# Patient Record
Sex: Male | Born: 1945
Health system: Southern US, Community
[De-identification: ages and names within clinical notes are randomized; demographics above are authoritative.]

## PROBLEM LIST (undated history)

## (undated) DIAGNOSIS — E785 Hyperlipidemia, unspecified: Secondary | ICD-10-CM

## (undated) DIAGNOSIS — M1990 Unspecified osteoarthritis, unspecified site: Secondary | ICD-10-CM

## (undated) DIAGNOSIS — H9190 Unspecified hearing loss, unspecified ear: Secondary | ICD-10-CM

## (undated) DIAGNOSIS — I10 Essential (primary) hypertension: Secondary | ICD-10-CM

## (undated) DIAGNOSIS — M545 Low back pain, unspecified: Secondary | ICD-10-CM

## (undated) DIAGNOSIS — I25810 Atherosclerosis of coronary artery bypass graft(s) without angina pectoris: Secondary | ICD-10-CM

## (undated) DIAGNOSIS — I714 Abdominal aortic aneurysm, without rupture, unspecified: Secondary | ICD-10-CM

## (undated) DIAGNOSIS — R197 Diarrhea, unspecified: Secondary | ICD-10-CM

## (undated) DIAGNOSIS — K5790 Diverticulosis of intestine, part unspecified, without perforation or abscess without bleeding: Secondary | ICD-10-CM

## (undated) DIAGNOSIS — I4892 Unspecified atrial flutter: Secondary | ICD-10-CM

## (undated) DIAGNOSIS — I491 Atrial premature depolarization: Secondary | ICD-10-CM

## (undated) DIAGNOSIS — K635 Polyp of colon: Secondary | ICD-10-CM

## (undated) DIAGNOSIS — K649 Unspecified hemorrhoids: Secondary | ICD-10-CM

## (undated) DIAGNOSIS — I2510 Atherosclerotic heart disease of native coronary artery without angina pectoris: Secondary | ICD-10-CM

## (undated) HISTORY — DX: Unspecified atrial flutter: I48.92

## (undated) HISTORY — PX: TONSILLECTOMY: SUR1361

## (undated) HISTORY — DX: Low back pain, unspecified: M54.50

## (undated) HISTORY — DX: Essential (primary) hypertension: I10

## (undated) HISTORY — PX: CORONARY ARTERY BYPASS GRAFT: SHX141

## (undated) HISTORY — DX: Atrial premature depolarization: I49.1

## (undated) HISTORY — DX: Hyperlipidemia, unspecified: E78.5

## (undated) HISTORY — DX: Diarrhea, unspecified: R19.7

## (undated) HISTORY — DX: Unspecified hemorrhoids: K64.9

## (undated) HISTORY — DX: Abdominal aortic aneurysm, without rupture: I71.4

## (undated) HISTORY — DX: Diverticulosis of intestine, part unspecified, without perforation or abscess without bleeding: K57.90

## (undated) HISTORY — DX: Atherosclerosis of coronary artery bypass graft(s) without angina pectoris: I25.810

## (undated) HISTORY — DX: Abdominal aortic aneurysm, without rupture, unspecified: I71.40

## (undated) HISTORY — DX: Atherosclerotic heart disease of native coronary artery without angina pectoris: I25.10

## (undated) HISTORY — DX: Unspecified osteoarthritis, unspecified site: M19.90

## (undated) HISTORY — DX: Unspecified hearing loss, unspecified ear: H91.90

## (undated) HISTORY — DX: Low back pain: M54.5

## (undated) HISTORY — PX: LUMBAR DISC ARTHROPLASTY: SHX699

---
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
## 2005-09-11 ENCOUNTER — Emergency Department (HOSPITAL_COMMUNITY): Admission: EM | Admit: 2005-09-11 | Discharge: 2005-09-11 | Payer: Self-pay | Admitting: Emergency Medicine

## 2005-11-13 HISTORY — PX: MAZE: SHX5063

## 2005-11-13 HISTORY — PX: CORONARY ARTERY BYPASS GRAFT: SHX141

## 2006-05-08 ENCOUNTER — Encounter: Payer: Self-pay | Admitting: Cardiology

## 2006-05-08 ENCOUNTER — Inpatient Hospital Stay (HOSPITAL_COMMUNITY): Admission: EM | Admit: 2006-05-08 | Discharge: 2006-05-22 | Payer: Self-pay | Admitting: Cardiology

## 2006-05-09 ENCOUNTER — Encounter: Payer: Self-pay | Admitting: Vascular Surgery

## 2006-06-14 ENCOUNTER — Encounter (HOSPITAL_COMMUNITY): Admission: RE | Admit: 2006-06-14 | Discharge: 2006-09-12 | Payer: Self-pay | Admitting: Cardiology

## 2010-08-03 ENCOUNTER — Ambulatory Visit: Payer: Self-pay | Admitting: Cardiology

## 2011-03-01 ENCOUNTER — Ambulatory Visit: Payer: Self-pay | Admitting: Cardiology

## 2011-03-01 ENCOUNTER — Other Ambulatory Visit: Payer: Self-pay | Admitting: *Deleted

## 2011-03-06 ENCOUNTER — Other Ambulatory Visit: Payer: Self-pay | Admitting: *Deleted

## 2011-03-06 DIAGNOSIS — E7800 Pure hypercholesterolemia, unspecified: Secondary | ICD-10-CM

## 2011-03-09 ENCOUNTER — Encounter: Payer: Self-pay | Admitting: Cardiology

## 2011-03-09 DIAGNOSIS — I4892 Unspecified atrial flutter: Secondary | ICD-10-CM | POA: Insufficient documentation

## 2011-03-09 DIAGNOSIS — M545 Low back pain, unspecified: Secondary | ICD-10-CM | POA: Insufficient documentation

## 2011-03-09 DIAGNOSIS — E785 Hyperlipidemia, unspecified: Secondary | ICD-10-CM | POA: Insufficient documentation

## 2011-03-15 ENCOUNTER — Other Ambulatory Visit (INDEPENDENT_AMBULATORY_CARE_PROVIDER_SITE_OTHER): Payer: Medicare Other | Admitting: *Deleted

## 2011-03-15 ENCOUNTER — Ambulatory Visit (INDEPENDENT_AMBULATORY_CARE_PROVIDER_SITE_OTHER): Payer: Medicare Other | Admitting: Cardiology

## 2011-03-15 ENCOUNTER — Encounter: Payer: Self-pay | Admitting: Cardiology

## 2011-03-15 ENCOUNTER — Ambulatory Visit: Payer: Self-pay | Admitting: Cardiology

## 2011-03-15 ENCOUNTER — Telehealth: Payer: Self-pay | Admitting: *Deleted

## 2011-03-15 VITALS — BP 112/66 | HR 74 | Ht 68.0 in | Wt 197.4 lb

## 2011-03-15 DIAGNOSIS — I2510 Atherosclerotic heart disease of native coronary artery without angina pectoris: Secondary | ICD-10-CM | POA: Insufficient documentation

## 2011-03-15 DIAGNOSIS — I491 Atrial premature depolarization: Secondary | ICD-10-CM

## 2011-03-15 DIAGNOSIS — E7800 Pure hypercholesterolemia, unspecified: Secondary | ICD-10-CM

## 2011-03-15 DIAGNOSIS — E785 Hyperlipidemia, unspecified: Secondary | ICD-10-CM

## 2011-03-15 LAB — HEPATIC FUNCTION PANEL
AST: 26 U/L (ref 0–37)
Albumin: 3.8 g/dL (ref 3.5–5.2)
Bilirubin, Direct: 0.1 mg/dL (ref 0.0–0.3)
Total Bilirubin: 1 mg/dL (ref 0.3–1.2)
Total Protein: 6.7 g/dL (ref 6.0–8.3)

## 2011-03-15 LAB — LIPID PANEL
Cholesterol: 146 mg/dL (ref 0–200)
HDL: 39.3 mg/dL (ref >39.00)
LDL Cholesterol: 85 mg/dL (ref 0–99)
Triglycerides: 107 mg/dL (ref 0.0–149.0)

## 2011-03-15 LAB — BASIC METABOLIC PANEL
BUN: 18 mg/dL (ref 6–23)
CO2: 27 mEq/L (ref 19–32)
Calcium: 8.8 mg/dL (ref 8.4–10.5)
Creatinine, Ser: 0.9 mg/dL (ref 0.4–1.5)
Glucose, Bld: 101 mg/dL — ABNORMAL HIGH (ref 70–99)
Sodium: 139 mEq/L (ref 135–145)

## 2011-03-15 MED ORDER — CARVEDILOL 6.25 MG PO TABS: 6.2500 mg | ORAL_TABLET | Freq: Two times a day (BID) | ORAL | Status: DC

## 2011-03-15 NOTE — Assessment & Plan Note (Addendum)
He is having no significant anginal symptoms. He remains active. Continue with risk factor modification with blood pressure and lipid control. I have encouraged him to get regular aerobic exercise 5-7 days a week. He needs to lose weight. I will increase his carvedilol dose to twice daily. I would consider a stress nuclear study next year.

## 2011-03-15 NOTE — Assessment & Plan Note (Signed)
Pulses are regular consistent with PACs. He is asymptomatic. ECG in September showed PACs.

## 2011-03-15 NOTE — Telephone Encounter (Signed)
Notified of lab results. 

## 2011-03-15 NOTE — Telephone Encounter (Signed)
Message copied by Murrell Redden on Wed Mar 15, 2011  5:26 PM ------      Message from: [place], PETER      Created: Wed Mar 15, 2011  1:39 PM       Chemistries all ok. Lipids stable. Continue current meds but needs to lose weight.

## 2011-03-15 NOTE — Patient Instructions (Signed)
We will call with the results of your blood work today.  Increase your Coreg to twice a day.  We will see you for a follow up visit in 6 months.  Keep up with your regular exercise.

## 2011-03-15 NOTE — Assessment & Plan Note (Signed)
We will followup on his fasting lab work today and adjust as needed. I have encouraged him to lose weight.

## 2011-03-15 NOTE — Progress Notes (Signed)
   [name] Date of Birth: 04-01-1946   History of Present Illness: [name] is seen for 6 months followup today. He states he has been doing very well over the past 6 months. He remains unemployed. He is doing a lot of yard work which is his main activity. He denies any palpitations, chest pain, shortness of breath, or edema. He has been taking his carvedilol once a day.  Current Outpatient Prescriptions on File Prior to Visit  Medication Sig Dispense Refill  . aspirin 81 MG tablet Take 81 mg by mouth daily.        Marland Kitchen atorvastatin (LIPITOR) 40 MG tablet Take 40 mg by mouth daily.        . B Complex-Biotin-FA (B-COMPLEX PO) Take by mouth daily.        . carvedilol (COREG) 6.25 MG tablet Take 1 tablet (6.25 mg total) by mouth 2 (two) times daily.  60 tablet  11  . ramipril (ALTACE) 2.5 MG tablet Take 2.5 mg by mouth daily.        . Saw Palmetto 450 MG CAPS Take by mouth daily.        . Ascorbic Acid (VITAMIN C) 1000 MG tablet Take 1,000 mg by mouth daily.        . Cholecalciferol (VITAMIN D) 1000 UNITS capsule Take 1,000 Units by mouth daily.          No Known Allergies  Past Medical History  Diagnosis Date  . Coronary artery disease   . Paroxysmal atrial flutter   . Dyslipidemia   . Low back pain     Past Surgical History  Procedure Date  . Coronary artery bypass graft 2007  . Maze 2007    MAZE PROCEDURE  . Tonsillectomy     History  Smoking status  . Former Smoker -- 1.0 packs/day for 30 years  . Types: Cigarettes  . Quit date: 11/13/2005  Smokeless tobacco  . Never Used    History  Alcohol Use No    Family History  Problem Relation Age of Onset  . Diabetes Mother   . Heart disease Father     Review of Systems:  All other systems were reviewed and are negative.  Physical Exam: BP 112/66  Pulse 74  Ht 5\' 8"  (1.727 m)  Wt 197 lb 6 oz (89.529 kg)  BMI 30.01 kg/m2 He is a pleasant obese white male in no acute distress. He has no JVD or bruits. There  is no adenopathy. Lungs are clear. Cardiac exam reveals an irregular rate without gallop or murmur. Abdomen is obese, soft, nontender. He has no edema. Pedal pulses are good. He is alert oriented x3. Cranial nerves II through XII are intact. LABORATORY DATA: Chemistries and lipid panel are pending today.  Assessment / Plan:

## 2011-03-31 NOTE — Op Note (Signed)
[name], PETTET NO.:  [id number]   MEDICAL RECORD NO.:  [id number]          PATIENT TYPE:  INP   LOCATION:  2306                         FACILITY:  [facility]   PHYSICIAN:  [name], M.D.  DATE OF BIRTH:  [birth year]   DATE OF PROCEDURE:  05/14/2006  DATE OF DISCHARGE:                                 OPERATIVE REPORT   PREOPERATIVE DIAGNOSES:  Ischemic cardiomyopathy, recent acute myocardial  infarction, three-vessel coronary artery disease, atrial fibrillation-  flutter.   POSTOPERATIVE DIAGNOSES:  Ischemic cardiomyopathy, recent acute myocardial  infarction, three-vessel coronary artery disease, atrial fibrillation-  flutter.   OPERATION:  1.  Coronary artery bypass grafting x4 (left internal mammary artery to LAD,      saphenous vein graft to OM, sequential saphenous vein graft to posterior      descending and posterolateral).  2.  Left and right-sided Maze procedure for preoperative atrial-fib-flutter.   SURGEON:  [name], MD.   ASSISTANT:  [name], MD, and [name], [place].   ANESTHESIA:  General by [name], MD.   INDICATIONS:  The patient is a 65 year old white male smoker, who presented  with ongoing chest pain for several days.  He had EKG changes and positive  cardiac enzymes.  Cardiac catheterization by [name] demonstrated  severe three-vessel coronary artery disease with moderate to severe  reduction in LV function.  He had TIMI-1 flow through a distal right  coronary.  He had a high-grade stenosis of the LAD and circumflex as well.  He was felt to be a candidate for a surgical coronary revascularization.  He  was maintained on IV Integrilin preoperatively.   Prior to the surgery, I examined the patient in the ICU and reviewed the  results of the cardiac cath and the two-D echo with the patient and family.  I discussed the indications and expected benefits of coronary artery bypass  surgery and  Maze procedure for treatment of his coronary artery disease and  preoperative atrial fibrillation-atrial flutter.  I reviewed the major  aspects of the planned procedure including the choice of conduits for grafts  to include mammary artery and endoscopically-harvested saphenous vein, the  location of the surgical incisions, the use of general anesthesia and  cardiopulmonary bypass, and the expected postoperative hospital recovery  period.  I also reviewed the alternatives to surgical therapy for him.  I  reviewed with them the risks of this operation including the risks of MI,  CVA, bleeding, blood transfusion requirement, infection, and death.  He also  understood there was a potential complication of postoperative pacemaker  requirement.  After reviewing these issues on more than one occasion, he  demonstrated his understanding and agreement to proceed with the surgery  under what I felt was an informed consent.   OPERATIVE FINDINGS:  The patient's heart was dilated and very  hypocontractile with inferior and apical scar.  He was given the aprotinin  protocol due to a prolonged administration of IV Integrilin preoperatively.  He also received a unit of platelets at the end of the surgery because of  diffuse  coagulopathy consistent with the above-platelet dysfunction.  He did  not require any packed cell transfusion despite a starting hematocrit of  29%.  The saphenous vein was harvested endoscopically from the right leg and  was of good quality.  The mammary artery was a small vessel but had adequate  flow.  The transesophageal echo showed no significant mitral regurgitation  and hypocontractile LV function with almost akinesia of the inferior wall.  His LV function globally did improve following separation from  cardiopulmonary bypass.   PROCEDURE:  The patient was brought to the operating room and placed supine  on the operating table where general anesthesia was induced under  invasive  hemodynamic monitoring.  The chest, abdomen, and legs were prepped with  Betadine and draped as a sterile field.  A sternal incision was made and the  saphenous vein was harvested endoscopically from the right leg.  The left  internal mammary artery was harvested as a pedicle graft near its origin at  the subclavian vessels.  Heparin was administered, and the ACT was  documented as being therapeutic.  The sternal retractor was placed and the  pericardium was opened and suspended.  Pursestrings were placed in the  ascending aorta and superior vena cava, and the patient was cannulated and  placed on bypass.  A second pursestring was placed in the lower right atrium  for bicaval atrial drainage.  Caval tapes were placed around each cava.   The coronary arteries were identified for grafting.  Cardioplegia catheters  were placed above the antegrade aortic and retrograde coronary sinus  cardioplegia.  The interatrial groove was dissected and opened.  The mammary  artery and vein grafts were prepared for the distal anastomoses.   The bipolar radiofrequency ablation device by Medtronic was then used to  place an ablation line around the atrial appendage.  The left-sided  pulmonary veins were then dissected and encircled with umbilical tape, and  the bipolar Medtronic clamp was then used to place an ablation line around  the left-sided atrial cuff around the pulmonary veins.  Next, the monopolar  ablation pen was used to connect the two encircling ablation lines between  the left atrial appendage and the left pulmonary veins.   The patient was cooled to 30 degrees, and the aorta cross-clamp was applied.  800 ml of cold blood cardioplegia were delivered in split doses between the  antegrade aorta and retrograde coronary sinus catheters.  There was a good  cardioplegic arrest, and septal temperature dropped to less than 14 degrees. Topical iced saline was used to augment myocardial  preservation.   The distal coronary anastomoses were then performed.  The first distal  anastomosis consisted of a sequential vein graft to the posterior descending  and posterolateral branch of the right coronary.  The posterior descending  was the larger, a 1.7-mm vessel, with a proximal 90% stenosis.  A side-to-  side anastomosis with the saphenous vein was constructed using running #7-0  Prolene, and there was excellent flow through the graft.  The second distal  anastomosis consisted of an end-to-side anastomosis to the posterolateral  branch of the right coronary.  This was made from the continuation of the  sequential vein graft.  This anastomosis was made with a running #7-0  Prolene, and the vessel was a 1.4-mm vessel.  Cardioplegia was redosed.   The third distal anastomosis was the obtuse marginal branch of the  circumflex.  This was a 1.5-mm vessel, approximately 80% stenosis in  the AV  groove.  A reverse saphenous vein was sewn end-to-side with running #7-0  Prolene, and there was good flow through the graft.  Cardioplegia was  redosed.   The fourth distal anastomosis was placed in the mid LAD, which was a 1.75-mm  vessel and had a proximal 80% stenosis.  The left IMA pedicle was brought  through an opening created in the left lateral pericardium and was brought  down onto the LAD and sewn end-to-side with a running #8-0 Prolene.  There  was excellent flow through the anastomosis after briefly releasing the  pedicle bull-dog on the mammary pedicle.  The bull-dog clamp was replaced,  and the pedicle was secured to the epicardium.  Cardioplegia was redosed.   Attention was then directed to the left-sided Maze procedure.  The caval  tapes were tightened, and a left atriotomy was performed.  The atrial  retractors were positioned.  The mitral valve was inspected and found to  have no structural defects.  The ablation encircling line around the right-  sided pulmonary veins was  then completed along the posterior aspect of the  atriotomy using the bipolar Medtronic clamp.  This completed the encircling  lesion around the right pulmonary veins.  Next, a connecting lesion between  the right-sided and left-sided pulmonary vein ablation lines was performed  using a bipolar ablation line on the floor of the left atrium.  Next, using  the monopolar RF device, a line was made to connect the left-sided pulmonary  vein encircling lesion to the annulus of the mitral valve at the P3 segment.   Next, the left atrial appendage was oversewn using two layers of running #4-  0 Prolene from the endocardial side.  This completed the left-sided Maze  procedure, and the left atriotomy was closed in two layers using a running  #4-0 Prolene.  Cardioplegia was redosed.   The right-sided Maze was then completed.  An incision was made at the base  of the right atrial appendage, and the bipolar Medtronic clamp was used to make an ablation line along the superior transverse aspect of the right  atrium.  Next, a vertical incision was made from the point where the  retrograde cardioplegia catheter was inserted and close to the AV groove  extending down to the interatrial septum, and retraction sutures were placed  to expose the inside of the right atrium.  There was no structural defect  noted of the tricuspid valve.  Two bipolar ablation lines were made from the  base of this vertical atriotomy running superiorly to the SVC and then  inferiorly to the IVC.  Next, the monopolar radiofrequency pen was used to  make two ablation lines connecting the annulus with the tricuspid valve at  the 10 o'clock and 2 o'clock positions to the previously made atriotomy  incisions.  Finally, an ablation line was made from the base of the vertical  atriotomy incision up to the coronary sinus, and a second monopolar ablation  line was made from the lower aspect of this atriotomy incision to the  lateral  aspect of the tricuspid valve annulus.  Care was taken to avoid  ablation to endocardial surface close to the conduction system.  This  completed the right-sided Maze procedure, and the two atriotomy incisions  were closed using a double layer of #4-0 Prolene.  Cardioplegia was redosed.   While the cross-clamp was still in place, two proximal vein anastomoses were  performed on the ascending aorta  using a 4.5 mm punch and a running #6-0  Prolene.  Prior to closing the final proximal anastomosis, the air was  vented from the coronaries and the left side of the heart using the usual  maneuvers on bypass.  At this point, the cross-clamp was removed, and a new  vent was placed in the ascending aorta to remove any residual air.  Air was  also aspirated from the coronaries with a 27-gauge needle, and the bypass  grafts were opened and each had good flow.   The patient was rewarmed and reperfused.  The atriotomy incisions were found  to be hemostatic.  The coronary anastomoses were checked and found to be  hemostatic.  Temporary pacing wires were applied.  When the patient was  rewarmed and reperfused, the lungs were reexpanded and the ventilator was  resumed.  The patient was then weaned from bypass being AV sequentially  paced on low dose Dopamine and Milrinone.  Blood pressure and cardiac output  were stable.  Protamine was administered without adverse reaction.  The  cannula was removed.  The mediastinum was irrigated with warm antibiotic  irrigation.  The leg incision was irrigated and then closed in a standard  fashion.  The patient was given platelets, which improved overall  coagulation function.  The superior pericardial fat was closed over the  aorta and the vein grafts.  Two mediastinal and a left pleural chest tube  were placed and brought out through separate incisions.  The sternum was  closed with interrupted steel wire.  The  pectoralis fascia was closed using a running #1  Vicryl.  The subcutaneous and skin layers were closed with a running Vicryl, and sterile dressings  were applied.  Total bypass time was 200 minutes with cross-clamp time of  140 minutes.      [name], M.D.  Electronically Signed     PV/MEDQ  D:  05/14/2006  T:  05/14/2006  Job:  [id number]   cc:   CVTS Office

## 2011-03-31 NOTE — Cardiovascular Report (Signed)
NAME:  [name], [name] NO.:  [id number]   MEDICAL RECORD NO.:  [id number]          PATIENT TYPE:  EMS   LOCATION:  ED                           FACILITY:  [facility]   PHYSICIAN:  Peter M. [place]iland, M.D.  DATE OF BIRTH:  30-Dec-1945   DATE OF PROCEDURE:  05/09/2006  DATE OF DISCHARGE:  05/08/2006                              CARDIAC CATHETERIZATION   INDICATION FOR PROCEDURE:  A 65 year old white male with history of tobacco  abuse and hyperlipidemia who presents with an acute non-Q-wave myocardial  infarction and atrial flutter.   Access is via the right femoral artery using standard Seldinger technique.   PROCEDURES:  Left heart catheterization, coronary and left ventricular  angiography.   EQUIPMENT USED:  A 6-French, 4-cm right and left Judkins catheters, 6-French  pigtail catheter, 6-French arterial sheath.   CONTRAST:  100 mL of Omnipaque.   MEDICATIONS:  Local anesthesia with 1% Xylocaine, Versed 2 mg IV.   HEMODYNAMIC DATA:  Aortic pressures 97/65 with a mean of 80 mmHg.  Left  ventricular pressure is 92 with an EDP of 19 mmHg.   ANGIOGRAPHIC DATA:  The left coronary artery arises and distributes  normally.  The left main coronary artery is without significant disease.   The left anterior descending artery has a segmental 70% to 80% stenosis in  the mid vessel following the first diagonal branch.  The LAD then extends  out around the apex.   The left circumflex coronary artery gives rise to a single, moderate-sized  marginal vessel.  It has a 70% to 80% stenosis proximally.   The right coronary arises and distributes normally.  It is a large, dominant  vessel.  It has diffuse irregularities in the mid vessel, up to 30%.  At the  crux, there is diffuse disease, up to 40%.  The right coronary artery is  then subtotally occluded at the distal vessel, at the PDA and posterolateral  bifurcation.  There is TIMI I flow in the posterolateral branch, and TIMI 0  flow in the PDA.  Both the PDA and posterolateral branch filled by left-to-  right collaterals and appeared to be at least moderate-sized vessels.   Left ventricular angiography was performed in the RAO view.  This  demonstrates normal left ventricular size.  There is severe hypokinesia  involving the basal to mid inferior wall with mild global hypokinesia.  Overall ejection fraction is estimated at 40% to 45%.  There is no  significant mitral insufficiency.   FINAL INTERPRETATION:  1.  Severe 3-vessel obstructive coronary artery disease with culprit lesion      in the distal right coronary artery.  2.  Moderate left ventricular dysfunction.   PLAN:  Would recommend coronary artery bypass surgery.           ______________________________  Peter M. [place], M.D.     PMJ/MEDQ  D:  05/09/2006  T:  05/09/2006  Job:  [id number]   cc:   Gabriel Earing, M.D.  Fax: [phone]   CVTS

## 2011-03-31 NOTE — Consult Note (Signed)
[name], [name] NO.:  [id number]   MEDICAL RECORD NO.:  [id number]          PATIENT TYPE:  EMS   LOCATION:  ED                           FACILITY:  [facility]   PHYSICIAN:  Artist Pais. Weingold, M.D.DATE OF BIRTH:  02/10/1947   DATE OF CONSULTATION:  09/11/2005  DATE OF DISCHARGE:                                   CONSULTATION   REQUESTING PHYSICIAN:  Bethann Berkshire, MD   [name] is a very pleasant 65 year old right-hand dominant male who  got his left nondominant hand caught in a roller while working at [employer] and presents today with a complex injury to his left hand.   He is 65 with no significant past medical history.   No known drug allergies.   No current medications.   No recent hospitalizations or surgeries.   FAMILY HISTORY:  Noncontributory.   SOCIAL HISTORY:  He smokes and occasionally drinks alcohol.   PHYSICAL EXAMINATION:  EXTREMITIES:  A roller-type injury to his left hand  with a complex laceration of the left thumb, metacarpophalangeal joint.  The  left hand webspace between the long and ring finger, left hand dorsally over  the fourth metacarpal head and neck area and left long finger  metacarpophalangeal joint area.  All of these wounds are complex in their  nature and grossly contaminated.   The patient was given local anesthesia, 2% lidocaine.  When the anesthesia  was obtained, his left hand was then prepped and draped in the usual sterile  fashion using the scrub brush and 3 liters of normal saline.  At this point  in time, all of these wounds were loosely closed with 4-0 nylon.  This  includes the thumb, dorsum of the hand, webspace between the long and ring,  and the long finger volarly.  All of these wounds were carefully dressed  with Xeroform, 4x4s, and compressive dressing.   The patient was discharged from the emergency department with Keflex and  Vicodin to follow up in my office on Thursday.   His examination revealed  that he had full neurovascular status and full tendon function.  X-rays were  negative.     Artist Pais Mina Marble, M.D.  Electronically Signed    MAW/MEDQ  D:  09/11/2005  T:  09/11/2005  Job:  [id number]

## 2011-03-31 NOTE — Discharge Summary (Signed)
[name], [name] NO.:  [id number]   MEDICAL RECORD NO.:  [id number]          PATIENT TYPE:  INP   LOCATION:  2001                         FACILITY:  [facility]   PHYSICIAN:  [name], M.D.  DATE OF BIRTH:  1945-11-17   DATE OF ADMISSION:  05/08/2006  DATE OF DISCHARGE:  05/22/2006                                 DISCHARGE SUMMARY   PRIMARY ADMITTING DIAGNOSIS:  Chest pain.   ADDITIONAL/DISCHARGE DIAGNOSES:  1.  Acute inferior myocardial infarction.  2.  Severe three-vessel coronary artery disease.  3.  Atrial fibrillation and atrial flutter.  4.  History of tobacco abuse.  5.  History of low back pain.  6.  History of left hand reconstructive surgery for an occupational injury.   PROCEDURES PERFORMED.:  1.  Cardiac catheterization.  2.  Coronary artery bypass grafting x4 (left internal mammary artery to the      left anterior descending artery, saphenous vein graft to the obtuse      marginal, sequential saphenous vein graft to the posterior descending      and posterolateral arteries).  3.  Endoscopic vein harvest, right leg.  4.  Left and right-sided maze procedure.   HISTORY:  The patient is a 65 year old white male who over the 2 weeks prior  to this admission had symptoms of waxing and waning chest pain.  On the date  of the admission he had pain which recurred and was unremitting, severe and  radiating to his left arm and jaw.  In the emergency department at [facility] he was found to have EKG changes as well as intermittent atrial flutter  with rapid ventricular response.  He was seen by [name] and was  transferred to [facility] for cardiac workup.   HOSPITAL COURSE:  He was admitted and started on IV heparin, nitroglycerin,  Integrilin and Lopressor as well as p.o. aspirin and IV amiodarone for his  atrial fibrillation and flutter.  He ultimately had a positive troponin at  10.3.  Following his admission a 2-D echocardiogram was  performed, which  showed global reduction in LV function with an EF of 30-35%.  There was no  significant right ventricular dysfunction, mitral regurgitation, aortic  insufficiency or aortic stenosis.  There was inferior wall akinesia.  He  underwent cardiac catheterization on June 27 and was found to have severe  three-vessel coronary artery disease including an 80% LAD, an 80% OM-1, a  subtotal distal right coronary artery, and an EF of 30-35%.  He was not felt  to be a good candidate for percutaneous intervention, and a cardiothoracic  surgery consultation was obtained.  The patient was seen by Dr. Kathlee Nations  Trigt, and it was agreed that his best course of action would be to proceed  with surgical revascularization at this time.  He also felt he would benefit  from a maze procedure for his atrial arrhythmias.  He discussed the with  surgery and its risks, benefits and alternatives to the patient and his  wife, and they agreed to proceed.  Dr. Donata Clay try recommended  waiting 4-5  days to allow him to recover from his MI.  He remained stable and pain-free  in the hospital prior to surgery.  He underwent a complete preoperative  workup, which included carotid Doppler studies which showed a 40-60% left  internal carotid artery stenosis with no hemodynamically significant  stenosis on the right, and ankle brachial indices which were greater than  1.0 bilaterally.  He was able to be taken to the operating room on May 14, 2006, and underwent CABG x4 and maze procedure as described in detail above.  He tolerated the procedure well and was transferred to the SICU in stable  condition.  He was able to be extubated shortly after surgery.  He was  hemodynamically stable, although he did require IV dopamine, milrinone and  __________ postoperatively for hypotension.  Also his underlying cardiac  rhythm was junctional bradycardia and he required external pacing.  Because  of his bradycardia, a  beta blocker was not started and amiodarone was held.  He remained in the unit for further observation.  He was started on IV  diuresis.  He was also treated with aggressive pulmonary toilet measures and  was mobilized with cardiac rehab phase one.  His drips were weaned and  discontinued.  By postop day #3 he was stable and ready for transfer to the  floor.  His main postoperative issue was his postoperative junctional  rhythm, which was not completely unexpected following maze procedure.  It  was felt that he should stay in the hospital and slowly start Coumadin and  be observed closely for the necessity of placing a permanent pacemaker.  His  sinus node function slowly did improve and his pacer was able to be weaned  and by May 21, 2006, was able to be discontinued completely.  He has  otherwise done very well postoperatively.  His surgical incision sites are  all healing well.  He has remained afebrile and his vital signs have been  stable.  He has continued to run low systolic blood pressures and still had  not been started on a beta blocker.  He has been slowly anticoagulated and  presently his INR is 1.2.  He is ambulating in the halls without problem.  He is tolerating a regular diet and is having normal bowel and bladder  function.  His most recent labs show a hemoglobin of 9.6, hematocrit 28.3,  white count 8.8, platelets 208.  Sodium 133, potassium 3.7, BUN 19,  creatinine 1.0.  His epicardial pacing wires will be discontinued on May 22, 2006, if his rhythm has remained stable.  It is felt that if he has  otherwise doing well at that time, he will hopefully be ready for discharge  home.  Hopefully, his blood pressure well improve to the point where he can  be started on an ACE inhibitor for his decreased LV function.  However, of  this is not possible during his admission, it will be started as an  outpatient.   DISCHARGE MEDICATIONS: 1.  Coumadin.  Home dose will be  determined by PT and INR drawn on the date      of discharge.  2.  Aspirin 81 daily.  3.  Lipitor 10 mg q.h.s.  4.  Advair 250/50 mcg one puff b.i.d.  5.  Nicotine patch 14 mg daily.  6.  Tylox one to two q.4-6h. p.r.n. for pain.   DISCHARGE INSTRUCTIONS:  He is asked to refrain from driving, heavy lifting  or strenuous activity.  He may continue to ambulate daily and to use his  incentive spirometer.  He may shower daily and clean his incisions with soap  and water.  He will continue his same preoperative diet.   DISCHARGE FOLLOW-UP:  He will need PT and INR drawn within 48 hours of  discharge by Dr. Elvis Coil the office for management of his Coumadin.  He  will need to make an appointment also to see Dr. Swaziland in 2 weeks for  follow up with an x-ray to be done at that time.  He will see Dr. Donata Clay  on August 17 at 12:00 p.m. and should bring his chest x-ray at this  appointment for Dr. Donata Clay to review.  In the interim if he experiences  any problems or has questions, he is asked to contact our office  immediately.      Coral Ceo, P.A.      [name], M.D.  Electronically Signed    GC/MEDQ  D:  05/21/2006  T:  05/21/2006  Job:  [id number]   cc:   Gabriel Earing, M.D.  Fax: [phone]   Peter M. Swaziland, M.D.  Fax: [phone]   [facility] Office

## 2011-03-31 NOTE — H&P (Signed)
NAME:  [name], [name] NO.:  [id number]   MEDICAL RECORD NO.:  [id number]          PATIENT TYPE:  INP   LOCATION:  NA                           FACILITY:  [facility]   PHYSICIAN:  Peter M. Swaziland, M.D.  DATE OF BIRTH:  06-Feb-1946   DATE OF ADMISSION:  05/08/2006  DATE OF DISCHARGE:                                HISTORY & PHYSICAL   HISTORY OF PRESENT ILLNESS:  Mr. Coolman is a 65 year old white male  previously healthy who presented to [facility] emergency room department  today with refractory chest pain.  He states over the past 2 weeks he has  had intermittent chest pain radiating to his left jaw and arm.  His pain  typically lasted 5 minutes and then resolved. At 11:30 p.m. last night, his  pain recurred and was unremitting.  It was more severe and again radiating  to his left arm and jaw.  He denied any shortness of breath, diaphoresis,  nausea, vomiting, palpitations or dizziness. In the emergency department,  his ECG showed slight ST suppression in the anterior precordial leads.  The  patient also had intermittent atrial flutter with rapid ventricular response  at a rate of 130-150 and with this he had increasing chest pain and  increasing ST depression.  He has no prior cardiac history.   PAST MEDICAL HISTORY:  1.  Low back pain.  2.  Status post T&A.  3.  Fracture of his arm as a child.   He has no prior history of diabetes, hypertension or hypercholesterolemia.  He has no known allergies.  His medications include 1 tablet of oxycodone  per day.   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 He is on his second marriage.  She has three children by his first  marriage.  He smokes 1-1/2 to 1 pack per day and smoked for 42 years.  He  denies alcohol use.   FAMILY HISTORY:  Father is age 65, he had coronary bypass surgery at age 53.  Mother has a history of diabetes. He has two sisters who are alive and well.   REVIEW OF SYSTEMS:  Otherwise unremarkable.   PHYSICAL EXAMINATION:  GENERAL:  The patient is a middle-aged white male in  no distress.  VITAL SIGNS:  Blood pressure is 110/60, pulse 60 in sinus rhythm with  intermittent atrial flutter, rapid ventricular response, respirations were  18.  HEENT:  Normocephalic, atraumatic.  Pupils equal, round, reactive.  Conjunctiva clear.  Oropharynx is clear.  NECK:  Without JVD, adenopathy, thyromegaly or bruits.  LUNGS:  Clear.  CARDIAC:  Exam reveals regular rate and rhythm without murmur, rub, gallop  or click.  ABDOMEN:  Soft, nontender without masses or hepatosplenomegaly.  EXTREMITIES:  Without edema.  Pulses are 2+ and symmetric.  There is no  phlebitis.  NEUROLOGIC:  Exam is intact.   LABORATORY DATA:  ECG in sinus rhythm shows minimal ST-segment depression  anteriorly with atrial flutter.  He has a rapid ventricular response and  increase ST depression in the anterior precordial leads. His chest  x-ray  shows no active disease.  White count 9400, hemoglobin 13.8, hematocrit  41.1, platelets 270,000.  Sodium 142, potassium 3.5, chloride 107, CO2 26,  BUN 16, creatinine 1.0, glucose of 138.  LFTs were normal. CPK-MB is 13.1  and then 9.6.  Troponin was 0.32 and then 0.22. Magnesium 1.9.  Coags were  normal.   IMPRESSION:  1.  Non-Q-wave myocardial infarction with pain exacerbated by atrial      flutter.  2.  Atrial flutter paroxysmal with rapid ventricular response.  3.  Tobacco abuse.  4.  Low back pain.  5.  Family history of coronary disease.   PLAN:  Will treat with aggressive medical therapy with aspirin, IV heparin,  IV nitroglycerin, IV Integrilin. The patient is loaded with IV Lopressor and  will begin a p.o. dose. He also will be placed on IV amiodarone to try and  maintain sinus rhythm. Will obtain serial cardiac enzymes, ECG, will check  lipid panel, A1c, BNP level, thyroid function studies. Will obtain an  echocardiogram  today.  Will also replete potassium.           ______________________________  Peter M. Swaziland, M.D.     PMJ/MEDQ  D:  05/08/2006  T:  05/08/2006  Job:  [id number]   cc:   Gabriel Earing, M.D.  Fax: [phone]

## 2011-03-31 NOTE — Consult Note (Signed)
[name], EDMONDS.:  [id number]   MEDICAL RECORD NO.:  [id number]          PATIENT TYPE:  INP   LOCATION:  2307                         FACILITY:  [facility]   PHYSICIAN:  [name], M.D.  DATE OF BIRTH:  1946/08/23   DATE OF CONSULTATION:  05/09/2006  DATE OF DISCHARGE:                                   CONSULTATION   REQUESTING PHYSICIAN:  Peter Swaziland, M.D.   CONSULTANT:  [name], M.D.   REASON FOR CONSULTATION:  Acute inferior MI with severe three vessel  coronary artery disease and reduced LV function.   CHIEF COMPLAINT:  Chest pain.   HISTORY OF PRESENT ILLNESS:  I was asked to evaluate this 65 year old white  male smoker for potential surgical coronary revascularization for a recently  diagnosed severe three vessel coronary artery disease.  The patient  presented to the emergency department on the 26th of June with refractory  chest pain which had been waxing and waning over approximately two weeks.  It was substernal, pressing, squeezing pain with radiation to the left jaw  and arm.  Usually the pain resolved after 5-10 minutes; however, on the  night of admission it was sustained and was unremitting.  There was no  associated shortness of breath or diaphoresis, nausea, vomiting, syncope, or  dizziness.  He presented to the emergency department where the 12-lead EKG  demonstrated ST segment depression in the anterior leads with intermittent  bursts of atrial flutter.  The patient was treated with nitroglycerin,  heparin, amiodarone, and beta blockers and was admitted to the hospital.  Since hospitalization his cardiac enzymes have gradually increased from a  troponin of 0.3 to a troponin of 10.3 and a CPK-MB of 20 to a CPK-MB of 140  ng/mL.   After admission a 2-D echocardiogram was performed which showed global  reduction in LV function with EF of 30-35%.  There was inferior wall  akinesia.  There was no significant RV dysfunction.   There is no significant  mitral regurgitation, aortic insufficiency, or aortic stenosis.  A cardiac  catheterization was performed today by Dr. Swaziland.  This demonstrated a  subtotaled distal right coronary with TIMI 1 flow through the posterolateral  branch of the distal right coronary.  The posterior descending filled via  collaterals.  The LAD had an 80% stenosis and the OM1 had an 80% stenosis.  EF was 30-35% with inferior wall akinesia.  The left ventricular end-  diastolic pressure was 19 mmHg.  Based on the patient's coronary anatomy he  is felt to be a potential candidate for surgical coronary revascularization.   PAST MEDICAL HISTORY:  1.  Low back pain.  2.  Status post left hand reconstructive surgery for an occupational      industrial injury.  3.  Status post T&A.   HOME MEDICATIONS:  None.   ALLERGIES:  None.   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  He has several children after two marriages.  He smokes over one  pack a day for 40 years.  He  denies alcohol intake.   FAMILY HISTORY:  Positive for coronary artery disease.  The patient's father  had bypass surgery at age 70.  There is no history of diabetes.   REVIEW OF SYSTEMS:  Constitutional review is negative for fever or weight  loss.  ENT review is negative for change in vision, difficulty swallowing,  or active dental problems.  Thoracic review is negative for thoracic trauma  or history of pulmonary nodule on chest x-ray.  He has a mild daily a.m.  productive cough.  GI review is negative for hepatitis, jaundice, or blood  per rectum.  Endocrine review is negative for diabetes or thyroid disease.  Vascular review is negative for DVT, claudication, or TIA.  Musculoskeletal  review is negative for arthritis, gout, or significant fractures.  He did  have a significant left soft tissue injury to the left hand which was  repaired by an orthopedic surgeon.  Neurologic  review is negative for stroke  or seizure.  Hematologic review is negative for bleeding disorder or blood  transfusions in the past.   PHYSICAL EXAMINATION:  VITAL SIGNS:  The patient is 5 feet 8 inches and  weighs 175 pounds.  Blood pressure is 108/60, pulse 80 in atrial  flutter/fibrillation, oxygen saturation 94% on 2 L.  GENERAL APPEARANCE:  That of a middle-aged white male in the ICU following  cardiac catheterization in no distress.  HEENT:  Normocephalic.  Full EOMs.  Pharynx clear.  Dentition unremarkable.  NECK:  Without JVD, mass, or carotid bruit.  LYMPHATICS:  No palpable supraclavicular or axillary adenopathy.  LUNGS:  Breath sounds are distant with scattered rhonchi.  CARDIOVASCULAR:  Irregular rhythm without S3 gallop, murmur, or rub.  ABDOMEN:  Soft, nontender without pulsatile mass or organomegaly.  EXTREMITIES:  No edema or cyanosis or clubbing with bounding 2+ pedal and  radial pulses.  NEUROLOGIC:  Is alert and oriented without focal motor deficit.  He is right  hand dominant.   LABORATORY DATA:  I reviewed the 2-D echocardiogram and cardiac  catheterization.  He has severe three-vessel coronary disease with a recent  subtotal occlusion of the right coronary and inferior wall MI.  Chest x-ray  shows interstitial peribronchial thickening consistent with COPD/bronchitis.  Cardiac enzymes are still rising.  His hematocrit is 41 with a platelet  count of 270,000.  LFTs are normal.  His LDL cholesterol is 110.   IMPRESSION AND PLAN:  Patient has severe coronary disease with a recent  inferior myocardial infarction and with moderate reduction in left  ventricular function.  He has associated atrial arrhythmias with atrial  flutter/fibrillation.  He would benefit from coronary revascularization with  bypass grafts to the left anterior descending, obtuse marginal, and  posterior descending and posterolateral branch of the right coronary.  He would also probably benefit  from a Maze procedure for his atrial arrhythmia.  I discussed the surgery planned in detail with the patient and his wife  including alternatives to surgery.   I would recommend a period of four to five days to recover from his  myocardial infarction and to allow time for pulmonary toilet prior to  coronary artery bypass graft plus Maze procedure.  The patient understands  this plan and is in agreement.  Thank you very much for the consultation.      [name], M.D.  Electronically Signed     PV/MEDQ  D:  05/09/2006  T:  05/09/2006  Job:  [id number]   cc:  Gabriel Earing, M.D.  Fax: [phone]

## 2011-03-31 NOTE — Op Note (Signed)
NAME:  [name], [name] NO.:  [id number]   MEDICAL RECORD NO.:  [id number]          PATIENT TYPE:  INP   LOCATION:  2306                         FACILITY:  [facility]   PHYSICIAN:  [name], M.D. DATE OF BIRTH:  1946/09/03   DATE OF PROCEDURE:  DATE OF DISCHARGE:                                 OPERATIVE REPORT   PROCEDURE:  Transesophageal echocardiogram.   Mr. Matters is a 65 year old male who presents today for coronary artery  bypass grafting and Maze procedure by Dr. Donata Clay.  The patient was  brought to the PACU holding area on the morning of surgery where pulmonary  artery and arterial lines were placed under sedation with local anesthesia  without difficulty.  The patient was subsequently taken to the operating  room where general anesthesia was performed.  Endotracheal tube was placed  without difficulty.  The transesophageal echocardiogram probe was heavily  lubricated in its sleeve and placed down the oropharynx without difficulty.   PREBYPASS CORONARY ARTERY BYPASS GRAFTING PER THE LEFT VENTRICLE:  1.  Left ventricle:  Left ventricle was concentrically thickened.  There was      moderate decrease in left ventricular function noted.  Papillary muscles      were well visualized.  There were no masses noted within the left      ventricular chamber.  There was moderate hypokinesis of the inferior and      septal walls on this examination.  2.  Aortic valve:  The aortic valve was trileaflet in nature.  There was no      significant aortic regurgitant flow on Doppler examination.  3.  Mitral valve:  The mitral valve appeared to function appropriately.      There was 1+ mitral regurgitation on Doppler examination.  4.  Left atrium:  Left atrium appeared to be within normal limits.  The      interatrial septum was intact.  The left atrial appendage was visualized      and no masses noted.  5.  Right ventricle:  Overall the right ventricle appeared  normal.  There      did not appear to be any significant tricuspid regurgitant flow on      Doppler examination.  In addition, there did not appear to be any      significant aortic regurgitant flow on examination.   POSTCORONARY BYPASS GRAFTING AND MAYS PROCEDURE:  The left ventricle  continued with moderate decrease in left ventricular function.  The  contractile pattern was somewhat improved with replacement of volume.  There  did not appear to be any other changes from prebypass examination.   The transesophageal echocardiogram probe was removed without difficulty.  The sleeve was intact.  The patient was subsequently taken to the surgical  intensive care unit in stable condition.           ______________________________  [name], M.D.     CE/MEDQ  D:  05/15/2006  T:  05/16/2006  Job:  [id number]   cc:   Patient's chart

## 2011-07-27 ENCOUNTER — Telehealth: Payer: Self-pay | Admitting: Cardiology

## 2011-07-27 NOTE — Telephone Encounter (Signed)
Call Back Phone#: [phone] Last OV, EKG, STRESS, ECHO

## 2011-07-27 NOTE — Telephone Encounter (Signed)
Called wondering if the cardiac clearance sent from Dr. Renato Battles for his back surgery had been approved by Dr. Swaziland. Please call back.

## 2011-07-27 NOTE — Telephone Encounter (Signed)
Called wanting to know if Dr. Swaziland had cleared him  for his back surgery. Advised received form from [facility] this AM and Dr. Swaziland signed and cleared him from cardiac surgery w/Dr Shelle Iron. Faxed back this AM.

## 2011-07-31 NOTE — Telephone Encounter (Signed)
Faxed over Last OV Note, EKG, ECHO, and Stress Test today.

## 2011-08-02 ENCOUNTER — Encounter (HOSPITAL_COMMUNITY): Payer: Medicare Other

## 2011-08-02 ENCOUNTER — Other Ambulatory Visit: Payer: Self-pay | Admitting: Specialist

## 2011-08-02 ENCOUNTER — Ambulatory Visit (HOSPITAL_COMMUNITY)
Admission: RE | Admit: 2011-08-02 | Discharge: 2011-08-02 | Disposition: A | Discharge status: Home / Self Care | Payer: Medicare Other | Source: Ambulatory Visit | Attending: Specialist | Admitting: Specialist

## 2011-08-02 ENCOUNTER — Ambulatory Visit (INDEPENDENT_AMBULATORY_CARE_PROVIDER_SITE_OTHER): Payer: Medicare Other | Admitting: Cardiology

## 2011-08-02 ENCOUNTER — Other Ambulatory Visit (HOSPITAL_COMMUNITY): Payer: Self-pay | Admitting: Specialist

## 2011-08-02 ENCOUNTER — Encounter: Payer: Self-pay | Admitting: Cardiology

## 2011-08-02 VITALS — BP 110/70 | Ht 66.0 in | Wt 192.8 lb

## 2011-08-02 DIAGNOSIS — Z01818 Encounter for other preprocedural examination: Secondary | ICD-10-CM

## 2011-08-02 DIAGNOSIS — IMO0002 Reserved for concepts with insufficient information to code with codable children: Secondary | ICD-10-CM | POA: Insufficient documentation

## 2011-08-02 DIAGNOSIS — I4892 Unspecified atrial flutter: Secondary | ICD-10-CM

## 2011-08-02 DIAGNOSIS — I10 Essential (primary) hypertension: Secondary | ICD-10-CM | POA: Insufficient documentation

## 2011-08-02 DIAGNOSIS — I2510 Atherosclerotic heart disease of native coronary artery without angina pectoris: Secondary | ICD-10-CM

## 2011-08-02 DIAGNOSIS — Z01812 Encounter for preprocedural laboratory examination: Secondary | ICD-10-CM | POA: Insufficient documentation

## 2011-08-02 DIAGNOSIS — E785 Hyperlipidemia, unspecified: Secondary | ICD-10-CM

## 2011-08-02 LAB — CBC
HCT: 44.1 % (ref 39.0–52.0)
Hemoglobin: 14.8 g/dL (ref 13.0–17.0)
MCH: 30.5 pg (ref 26.0–34.0)
MCHC: 33.6 g/dL (ref 30.0–36.0)
MCV: 90.7 fL (ref 78.0–100.0)
Platelets: 203 10*3/uL (ref 150–400)
RBC: 4.86 MIL/uL (ref 4.22–5.81)
RDW: 13.1 % (ref 11.5–15.5)
WBC: 4.7 10*3/uL (ref 4.0–10.5)

## 2011-08-02 LAB — SURGICAL PCR SCREEN
MRSA, PCR: INVALID — AB
Staphylococcus aureus: INVALID — AB

## 2011-08-02 LAB — URINALYSIS, ROUTINE W REFLEX MICROSCOPIC
Bilirubin Urine: NEGATIVE
Glucose, UA: NEGATIVE mg/dL
Hgb urine dipstick: NEGATIVE
Ketones, ur: NEGATIVE mg/dL
Leukocytes, UA: NEGATIVE
Nitrite: NEGATIVE
Protein, ur: NEGATIVE mg/dL
Specific Gravity, Urine: 1.017 (ref 1.005–1.030)
Urobilinogen, UA: 0.2 mg/dL (ref 0.0–1.0)
pH: 7 (ref 5.0–8.0)

## 2011-08-02 LAB — COMPREHENSIVE METABOLIC PANEL
ALT: 18 U/L (ref 0–53)
AST: 17 U/L (ref 0–37)
Albumin: 3.7 g/dL (ref 3.5–5.2)
Alkaline Phosphatase: 79 U/L (ref 39–117)
BUN: 14 mg/dL (ref 6–23)
CO2: 31 mEq/L (ref 19–32)
Calcium: 9.6 mg/dL (ref 8.4–10.5)
Chloride: 103 mEq/L (ref 96–112)
Creatinine, Ser: 0.66 mg/dL (ref 0.50–1.35)
GFR calc Af Amer: >60 mL/min (ref >60)
GFR calc non Af Amer: >60 mL/min (ref >60)
Glucose, Bld: 98 mg/dL (ref 70–99)
Potassium: 4.7 mEq/L (ref 3.5–5.1)
Sodium: 140 mEq/L (ref 135–145)
Total Bilirubin: 0.5 mg/dL (ref 0.3–1.2)
Total Protein: 7.2 g/dL (ref 6.0–8.3)

## 2011-08-02 LAB — PROTIME-INR
INR: 0.96 (ref 0.00–1.49)
Prothrombin Time: 13 seconds (ref 11.6–15.2)

## 2011-08-02 NOTE — Assessment & Plan Note (Signed)
Status post CABG in 2007. He remains asymptomatic. He had a normal stress Cardiolite study in 2008. He is cleared for his planned back surgery. We will followup again in 6 months.

## 2011-08-02 NOTE — Patient Instructions (Signed)
Good luck with your back surgery  I will see you again in 6 months.

## 2011-08-02 NOTE — Assessment & Plan Note (Signed)
Status post Maze procedure without recurrence.

## 2011-08-02 NOTE — Progress Notes (Signed)
   [name] Date of Birth: 1946-07-05   History of Present Illness: [name] is seen for  followup today. He has a history of coronary disease and is status post CABG with a Maze procedure in 2007. This included an LIMA graft to LAD, saphenous vein graft to the obtuse marginal vessel, and saphenous vein graft sequentially to the PDA and posterior lateral branches of the right coronary. He recently suffered a low back injury with bulging disc and sciatica. He is scheduled for back surgery tomorrow. He denies any symptoms of chest pain or shortness of breath. He denies any palpitations. He has had no tachycardia.  Current Outpatient Prescriptions on File Prior to Visit  Medication Sig Dispense Refill  . Ascorbic Acid (VITAMIN C) 1000 MG tablet Take 1,000 mg by mouth daily.        Marland Kitchen aspirin 81 MG tablet Take 81 mg by mouth daily.        Marland Kitchen atorvastatin (LIPITOR) 40 MG tablet Take 20 mg by mouth daily.       . carvedilol (COREG) 6.25 MG tablet Take 1 tablet (6.25 mg total) by mouth 2 (two) times daily.  60 tablet  11  . Cholecalciferol (VITAMIN D) 1000 UNITS capsule Take 1,000 Units by mouth daily.        . ramipril (ALTACE) 2.5 MG tablet Take 2.5 mg by mouth daily.        . Saw Palmetto 450 MG CAPS Take by mouth daily.        . B Complex-Biotin-FA (B-COMPLEX PO) Take by mouth daily.          No Known Allergies  Past Medical History  Diagnosis Date  . Coronary artery disease   . Paroxysmal atrial flutter   . Dyslipidemia   . Low back pain   . PAC (premature atrial contraction)     Past Surgical History  Procedure Date  . Coronary artery bypass graft 2007  . Maze 2007    MAZE PROCEDURE  . Tonsillectomy     History  Smoking status  . Former Smoker -- 1.0 packs/day for 30 years  . Types: Cigarettes  . Quit date: 11/13/2005  Smokeless tobacco  . Never Used    History  Alcohol Use No    Family History  Problem Relation Age of Onset  . Diabetes Mother   . Heart disease  Father     Review of Systems: He has low back pain and right leg sciatica. All other systems were reviewed and are negative.  Physical Exam: BP 110/70  Ht 5\' 6"  (1.676 m)  Wt 192 lb 12.8 oz (87.454 kg)  BMI 31.12 kg/m2 He is a pleasant obese white male in no acute distress. He has no JVD or bruits. There is no adenopathy. Lungs are clear. Cardiac exam reveals an irregular rate without gallop or murmur. Abdomen is obese, soft, nontender. He has no edema. Pedal pulses are good. He is alert oriented x3. Cranial nerves II through XII are intact. LABORATORY DATA: ECG demonstrates normal sinus rhythm with PACs. It is otherwise normal.  Assessment / Plan:

## 2011-08-03 ENCOUNTER — Observation Stay (HOSPITAL_COMMUNITY)
Admission: RE | Admit: 2011-08-03 | Discharge: 2011-08-04 | Disposition: A | Discharge status: Home / Self Care | Payer: Medicare Other | Source: Ambulatory Visit | Attending: Specialist | Admitting: Specialist

## 2011-08-03 ENCOUNTER — Other Ambulatory Visit: Payer: Self-pay | Admitting: Specialist

## 2011-08-03 ENCOUNTER — Ambulatory Visit (HOSPITAL_COMMUNITY): Payer: Medicare Other

## 2011-08-03 DIAGNOSIS — Z01812 Encounter for preprocedural laboratory examination: Secondary | ICD-10-CM | POA: Insufficient documentation

## 2011-08-03 DIAGNOSIS — Z79899 Other long term (current) drug therapy: Secondary | ICD-10-CM | POA: Insufficient documentation

## 2011-08-03 DIAGNOSIS — Z01818 Encounter for other preprocedural examination: Secondary | ICD-10-CM | POA: Insufficient documentation

## 2011-08-03 DIAGNOSIS — I10 Essential (primary) hypertension: Secondary | ICD-10-CM | POA: Insufficient documentation

## 2011-08-03 DIAGNOSIS — M5126 Other intervertebral disc displacement, lumbar region: Principal | ICD-10-CM | POA: Insufficient documentation

## 2011-08-03 DIAGNOSIS — I2510 Atherosclerotic heart disease of native coronary artery without angina pectoris: Secondary | ICD-10-CM | POA: Insufficient documentation

## 2011-08-03 DIAGNOSIS — Z951 Presence of aortocoronary bypass graft: Secondary | ICD-10-CM | POA: Insufficient documentation

## 2011-08-03 DIAGNOSIS — R209 Unspecified disturbances of skin sensation: Secondary | ICD-10-CM | POA: Insufficient documentation

## 2011-08-04 LAB — BASIC METABOLIC PANEL
CO2: 25 mEq/L (ref 19–32)
Chloride: 103 mEq/L (ref 96–112)
Creatinine, Ser: 0.66 mg/dL (ref 0.50–1.35)
Potassium: 3.8 mEq/L (ref 3.5–5.1)
Sodium: 137 mEq/L (ref 135–145)

## 2011-08-04 LAB — CBC
MCV: 90.1 fL (ref 78.0–100.0)
Platelets: 176 10*3/uL (ref 150–400)
RBC: 4.06 MIL/uL — ABNORMAL LOW (ref 4.22–5.81)
WBC: 6.9 10*3/uL (ref 4.0–10.5)

## 2011-08-04 NOTE — Op Note (Signed)
[name], [name]  MEDICAL RECORD NO.:  [id number]  LOCATION:  1619                         FACILITY:  [facility]  PHYSICIAN:  Jene Every, M.D.    DATE OF BIRTH:  [birth year]  DATE OF PROCEDURE: DATE OF DISCHARGE:                              OPERATIVE REPORT   PREOPERATIVE DIAGNOSIS:  Extruded herniated nucleus pulposus with compression of the L4 nerve root on the left.  POSTOPERATIVE DIAGNOSIS:  Extruded herniated nucleus pulposus with compression of the L4 nerve root on the left.  PROCEDURES PERFORMED:  Decompression at the L3-L4 centrally of bilateral hemilaminotomies, foraminotomies of L4 and L3, microdiskectomy at L3-L4 on the right with retrieval of free fragment.  ANESTHESIA:  General.  ASSISTANT:  [name], P.A.  HISTORY:  65 year old with myotomal weakness, dermatomal dysesthesias, right leg with extruded fragments noted on his MRI, is indicated for decompression.  The extruded fragment was into the 4 foramen compressing the 4 root out of cephalad from L4-L5 or caudad from L3-L4.  Indicated for decompression and removal of the free fragment.  Risks and benefits discussed including bleeding, infection, damage to vascular structures, CSF leakage, epidural fibrosis, adjacent segment disease, need for fusion in future, DVT, PE, anesthetic complications, etc.  TECHNIQUE:  With the patient in supine position, after induction of adequate general anesthesia and 2 g of Kefzol, placed prone on the Purple Sage frame.  All bony prominences well padded.  Lumbar region prepped and draped in usual sterile fashion.  Two 18-gauge spinal needle was utilized to localize the L3-L4, L4-L5 interspace, confirmed with x-ray. With him in the supine position by x-ray, it was felt that entering the interlaminar window at L3-L4 would allow retrieval of free fragment better than at L4-L5.  Skin incision was made from spinous process at L3- L4 after  we sterilely prepped the region.  Subcutaneous tissue was dissected.  Bipolar electrocautery was utilized to achieve hemostasis. Dorsolumbar fascia identified and divided in line with skin incision. Paraspinous muscle elevated from lamina of L3-L4 bilaterally. McCullough retractor was placed.  Confirmatory radiographs obtained with Kochers on L3-L4 spinous process.  He had a hypertrophic spinous processes with bursal fluid between them.  We therefore performed a central decompression removing portion of the spinous process of L3 and L4.  Obtained the operating microscope and draped and brought into the surgical field.  We enlarged the central laminotomy.  We utilized a straight curette to detach ligamentum flavum from the cephalad edge of 4.  Performed hemilaminotomies bilaterally of the caudad edge of 3, detached ligamentum flavum.  Neural patty placed beneath the ligamentum flavum, and ligamentum flavum was removed bilaterally.  He had severe lateral recess stenosis particularly on the right, centrally into the right.  We decompressed lateral recess in the medial border of pedicle preserving the neural elements at all times with a neural patty. Following decompression to the lateral recess, the 4 was compressed laterally by a large extruded fragment.  This was mobilized with a nerve hook and multiple free fragments were retrieved and sent to Pathology. We had performed foraminotomies of L3 and L4.  We tracked the herniation to the disk space.  There was no herniation from  the disk space.  After this, retrieved the multiple fragments, the hockey-stick probe passed freely up the foramen of L3 and L4, also on the left at L3 and L4.  We decompressed the lateral recess on the left as well as it was fairly stenotic as well.  Bone wax placed on the cancellus surfaces.  Wound was copiously irrigated and obtained a confirmatory radiograph with a hockey stick in the foramen of 4.  Inspection  revealed no CSF leaks or active bleeding.  We used bipolar cautery to achieve hemostasis.  Removed McCullough retractor.  Paraspinous muscles inspected with no evidence of active bleeding.  Dorsolumbar fascia reapproximated with #1 Vicryl interrupted figure-of-8 sutures.  We placed thrombin-soaked  Gelfoam on to protect the thecal sac.  Subcu with 2-0 Vicryl simple sutures, skin was reapproximated with staples.  Wounds dressed sterilely.  Placed supine on hospital bed, extubated without difficulty, and transported to the recovery room in satisfactory condition.  The patient tolerated the procedure well.  No complications.  ESTIMATED BLOOD LOSS:  Minimal blood loss.     Jene Every, M.D.     [name]  D:  08/03/2011  T:  08/03/2011  Job:  [id number]  Electronically Signed by Jene Every M.D. on 08/04/2011 03:11:16 PM

## 2011-08-05 LAB — MRSA CULTURE

## 2011-08-11 ENCOUNTER — Other Ambulatory Visit: Payer: Self-pay | Admitting: *Deleted

## 2011-08-11 MED ORDER — ATORVASTATIN CALCIUM 40 MG PO TABS: 20.0000 mg | ORAL_TABLET | Freq: Every day | ORAL | Status: DC

## 2011-08-11 MED ORDER — RAMIPRIL 2.5 MG PO TABS: 2.5000 mg | ORAL_TABLET | Freq: Every day | ORAL | Status: DC

## 2011-08-11 NOTE — Telephone Encounter (Signed)
escribe medication per fax request  

## 2011-08-19 NOTE — Discharge Summary (Signed)
  NAMEJERIMIAH, [name]  MEDICAL RECORD NO.:  [id number]  LOCATION:  1619                         FACILITY:  [facility]  PHYSICIAN:  Jene Every, M.D.    DATE OF BIRTH:  Mar 28, 1946  DATE OF ADMISSION:  08/03/2011 DATE OF DISCHARGE:  08/04/2011                              DISCHARGE SUMMARY   ADMISSION DIAGNOSIS:  Include herniated nucleus pulposus with extruded fragment compressing L4 nerve root on the left.  DISCHARGE DIAGNOSES:  Include herniated nucleus pulposus with extruded fragment compressing L4 nerve root on the left, status post lumbar decompression 3-4 centrally with bilateral hemilaminotomies, foraminotomies, L4 and L3 with microdiskectomy at L3-4.  HOSPITAL COURSE:  Uneventful.  DISPOSITION:  Postop day #1, patient was stable to be discharged home. He was up and ambulating without significant discomfort.  He is to follow up with Dr. Shelle Iron approximately 10 to 14 days for suture removal.  ACTIVITIES:  To walk as tolerated utilizing back precautions.  MEDICATIONS:  As per med rec sheet.  DIET:  As tolerated.  CONDITION ON DISCHARGE:  Stable.  FINAL DIAGNOSIS:  Doing well, status post lumbar decompression, microdiskectomy at L3-4.     Roma Schanz, P.A.   ______________________________ Jene Every, M.D.    CS/MEDQ  D:  08/14/2011  T:  08/14/2011  Job:  [id number]  Electronically Signed by Roma Schanz P.A. on 08/18/2011 01:27:30 PM Electronically Signed by Jene Every M.D. on 08/19/2011 09:36:00 AM

## 2012-02-07 ENCOUNTER — Ambulatory Visit (INDEPENDENT_AMBULATORY_CARE_PROVIDER_SITE_OTHER): Payer: Medicare Other | Admitting: Cardiology

## 2012-02-07 ENCOUNTER — Encounter: Payer: Self-pay | Admitting: Cardiology

## 2012-02-07 VITALS — BP 110/68 | HR 66 | Ht 66.0 in | Wt 204.0 lb

## 2012-02-07 DIAGNOSIS — I4892 Unspecified atrial flutter: Secondary | ICD-10-CM

## 2012-02-07 DIAGNOSIS — I2510 Atherosclerotic heart disease of native coronary artery without angina pectoris: Secondary | ICD-10-CM

## 2012-02-07 DIAGNOSIS — E785 Hyperlipidemia, unspecified: Secondary | ICD-10-CM

## 2012-02-07 DIAGNOSIS — Z951 Presence of aortocoronary bypass graft: Secondary | ICD-10-CM

## 2012-02-07 LAB — HEPATIC FUNCTION PANEL
ALT: 17 U/L (ref 0–53)
AST: 21 U/L (ref 0–37)
Total Protein: 7.5 g/dL (ref 6.0–8.3)

## 2012-02-07 LAB — BASIC METABOLIC PANEL
Calcium: 8.9 mg/dL (ref 8.4–10.5)
Creatinine, Ser: 0.8 mg/dL (ref 0.4–1.5)
GFR: 109.07 mL/min (ref >60.00)
Sodium: 136 mEq/L (ref 135–145)

## 2012-02-07 LAB — LIPID PANEL
HDL: 47.3 mg/dL (ref >39.00)
Triglycerides: 158 mg/dL — ABNORMAL HIGH (ref 0.0–149.0)

## 2012-02-07 LAB — LDL CHOLESTEROL, DIRECT: Direct LDL: 124.1 mg/dL

## 2012-02-07 NOTE — Patient Instructions (Signed)
I will check lab work today and call you with the results.  Continue your current therapy.  Exercise more and lose weight.  I will see you back in 6 months.

## 2012-02-07 NOTE — Progress Notes (Signed)
   [name] Date of Birth: [birth year]   History of Present Illness: [name] is seen for  followup today. He has a history of coronary disease and is status post CABG with a Maze procedure in 2007. This included an LIMA graft to LAD, saphenous vein graft to the obtuse marginal vessel, and saphenous vein graft sequentially to the PDA and posterior lateral branches of the right coronary. Since his last visit here he did undergo back surgery. He still has some numbness in his leg but is able to walk well now. He has gained an additional 11 pounds since his last visit. He is not as active and remains unemployed. He denies any chest pain or shortness of breath. He's had no edema or palpitations.  Current Outpatient Prescriptions on File Prior to Visit  Medication Sig Dispense Refill  . Ascorbic Acid (VITAMIN C) 1000 MG tablet Take 1,000 mg by mouth daily.        Marland Kitchen aspirin 81 MG tablet Take 81 mg by mouth daily.        Marland Kitchen atorvastatin (LIPITOR) 40 MG tablet Take 0.5 tablets (20 mg total) by mouth daily.  30 tablet  5  . carvedilol (COREG) 6.25 MG tablet Take 1 tablet (6.25 mg total) by mouth 2 (two) times daily.  60 tablet  11  . DISCONTD: ramipril (ALTACE) 2.5 MG tablet Take 1 tablet (2.5 mg total) by mouth daily.  30 tablet  5    No Known Allergies  Past Medical History  Diagnosis Date  . Coronary artery disease   . Paroxysmal atrial flutter   . Dyslipidemia   . Low back pain   . PAC (premature atrial contraction)     Past Surgical History  Procedure Date  . Coronary artery bypass graft 2007  . Maze 2007    MAZE PROCEDURE  . Tonsillectomy     History  Smoking status  . Former Smoker -- 1.0 packs/day for 30 years  . Types: Cigarettes  . Quit date: 11/13/2005  Smokeless tobacco  . Never Used    History  Alcohol Use No    Family History  Problem Relation Age of Onset  . Diabetes Mother   . Heart disease Father     Review of Systems: As noted in history of present  illness. All other systems are reviewed and are negative.   Physical Exam: BP 110/68  Pulse 66  Ht 5\' 6"  (1.676 m)  Wt 204 lb (92.534 kg)  BMI 32.93 kg/m2 He is a pleasant obese white male in no acute distress. He has no JVD or bruits. There is no adenopathy. Lungs are clear. Cardiac exam reveals an irregular rate without gallop or murmur. Abdomen is obese, soft, nontender. He has no edema. Pedal pulses are good. He is alert oriented x3. Cranial nerves II through XII are intact. LABORATORY DATA:   Assessment / Plan:

## 2012-02-07 NOTE — Assessment & Plan Note (Signed)
It has been a year since his last blood work. We will check fasting lab work today. He will remain on atorvastatin.

## 2012-02-07 NOTE — Assessment & Plan Note (Signed)
He remains asymptomatic from a cardiac standpoint. We will continue therapy with aspirin, beta blocker, ACE inhibitor, and statin therapy. I will followup again in 6 months.

## 2012-02-07 NOTE — Assessment & Plan Note (Signed)
No clinical evidence of recurrence.

## 2012-02-08 ENCOUNTER — Other Ambulatory Visit: Payer: Self-pay

## 2012-02-08 DIAGNOSIS — E785 Hyperlipidemia, unspecified: Secondary | ICD-10-CM

## 2012-05-10 ENCOUNTER — Other Ambulatory Visit (INDEPENDENT_AMBULATORY_CARE_PROVIDER_SITE_OTHER): Payer: Medicare Other

## 2012-05-10 DIAGNOSIS — E785 Hyperlipidemia, unspecified: Secondary | ICD-10-CM

## 2012-05-10 LAB — HEPATIC FUNCTION PANEL
ALT: 22 U/L (ref 0–53)
Alkaline Phosphatase: 71 U/L (ref 39–117)
Bilirubin, Direct: 0.1 mg/dL (ref 0.0–0.3)
Total Bilirubin: 0.6 mg/dL (ref 0.3–1.2)
Total Protein: 6.9 g/dL (ref 6.0–8.3)

## 2012-05-10 LAB — LIPID PANEL
Cholesterol: 145 mg/dL (ref 0–200)
LDL Cholesterol: 81 mg/dL (ref 0–99)

## 2013-11-13 DIAGNOSIS — K635 Polyp of colon: Secondary | ICD-10-CM

## 2013-11-13 HISTORY — DX: Polyp of colon: K63.5

## 2014-04-23 ENCOUNTER — Encounter: Payer: Self-pay | Admitting: Gastroenterology

## 2015-05-26 ENCOUNTER — Other Ambulatory Visit: Payer: Self-pay | Admitting: Family Medicine

## 2015-05-26 DIAGNOSIS — Z136 Encounter for screening for cardiovascular disorders: Secondary | ICD-10-CM

## 2015-06-03 ENCOUNTER — Ambulatory Visit
Admission: RE | Admit: 2015-06-03 | Discharge: 2015-06-03 | Disposition: A | Discharge status: Home / Self Care | Payer: PPO | Source: Ambulatory Visit | Attending: Family Medicine | Admitting: Family Medicine

## 2015-06-03 DIAGNOSIS — Z136 Encounter for screening for cardiovascular disorders: Secondary | ICD-10-CM

## 2015-11-16 DIAGNOSIS — I10 Essential (primary) hypertension: Secondary | ICD-10-CM | POA: Diagnosis not present

## 2015-11-16 DIAGNOSIS — J069 Acute upper respiratory infection, unspecified: Secondary | ICD-10-CM | POA: Diagnosis not present

## 2015-11-16 DIAGNOSIS — E7800 Pure hypercholesterolemia, unspecified: Secondary | ICD-10-CM | POA: Diagnosis not present

## 2016-04-11 DIAGNOSIS — Z961 Presence of intraocular lens: Secondary | ICD-10-CM | POA: Diagnosis not present

## 2016-04-11 DIAGNOSIS — H02839 Dermatochalasis of unspecified eye, unspecified eyelid: Secondary | ICD-10-CM | POA: Diagnosis not present

## 2016-04-11 DIAGNOSIS — H26491 Other secondary cataract, right eye: Secondary | ICD-10-CM | POA: Diagnosis not present

## 2016-04-11 DIAGNOSIS — I10 Essential (primary) hypertension: Secondary | ICD-10-CM | POA: Diagnosis not present

## 2016-04-18 DIAGNOSIS — Z9841 Cataract extraction status, right eye: Secondary | ICD-10-CM | POA: Diagnosis not present

## 2016-04-18 DIAGNOSIS — H2512 Age-related nuclear cataract, left eye: Secondary | ICD-10-CM | POA: Diagnosis not present

## 2016-04-18 DIAGNOSIS — H25012 Cortical age-related cataract, left eye: Secondary | ICD-10-CM | POA: Diagnosis not present

## 2016-04-18 DIAGNOSIS — H04123 Dry eye syndrome of bilateral lacrimal glands: Secondary | ICD-10-CM | POA: Diagnosis not present

## 2016-04-18 DIAGNOSIS — H16223 Keratoconjunctivitis sicca, not specified as Sjogren's, bilateral: Secondary | ICD-10-CM | POA: Diagnosis not present

## 2016-04-18 DIAGNOSIS — H40013 Open angle with borderline findings, low risk, bilateral: Secondary | ICD-10-CM | POA: Diagnosis not present

## 2016-04-18 DIAGNOSIS — Z961 Presence of intraocular lens: Secondary | ICD-10-CM | POA: Diagnosis not present

## 2016-05-24 DIAGNOSIS — I2510 Atherosclerotic heart disease of native coronary artery without angina pectoris: Secondary | ICD-10-CM | POA: Diagnosis not present

## 2016-05-24 DIAGNOSIS — I119 Hypertensive heart disease without heart failure: Secondary | ICD-10-CM | POA: Diagnosis not present

## 2016-05-24 DIAGNOSIS — E782 Mixed hyperlipidemia: Secondary | ICD-10-CM | POA: Diagnosis not present

## 2016-05-24 DIAGNOSIS — Z125 Encounter for screening for malignant neoplasm of prostate: Secondary | ICD-10-CM | POA: Diagnosis not present

## 2016-05-24 DIAGNOSIS — Z0000 Encounter for general adult medical examination without abnormal findings: Secondary | ICD-10-CM | POA: Diagnosis not present

## 2016-05-24 DIAGNOSIS — Z1389 Encounter for screening for other disorder: Secondary | ICD-10-CM | POA: Diagnosis not present

## 2016-05-24 DIAGNOSIS — I77811 Abdominal aortic ectasia: Secondary | ICD-10-CM | POA: Diagnosis not present

## 2016-12-22 ENCOUNTER — Emergency Department (HOSPITAL_COMMUNITY): Payer: PPO

## 2016-12-22 ENCOUNTER — Encounter (HOSPITAL_COMMUNITY): Payer: Self-pay | Admitting: Emergency Medicine

## 2016-12-22 ENCOUNTER — Emergency Department (HOSPITAL_COMMUNITY)
Admission: EM | Admit: 2016-12-22 | Discharge: 2016-12-22 | Disposition: A | Discharge status: Home / Self Care | Payer: PPO | Attending: Emergency Medicine | Admitting: Emergency Medicine

## 2016-12-22 DIAGNOSIS — Z951 Presence of aortocoronary bypass graft: Secondary | ICD-10-CM | POA: Insufficient documentation

## 2016-12-22 DIAGNOSIS — R079 Chest pain, unspecified: Secondary | ICD-10-CM | POA: Diagnosis not present

## 2016-12-22 DIAGNOSIS — R0600 Dyspnea, unspecified: Secondary | ICD-10-CM | POA: Diagnosis not present

## 2016-12-22 DIAGNOSIS — R600 Localized edema: Secondary | ICD-10-CM | POA: Insufficient documentation

## 2016-12-22 DIAGNOSIS — Z87891 Personal history of nicotine dependence: Secondary | ICD-10-CM | POA: Insufficient documentation

## 2016-12-22 DIAGNOSIS — Z7982 Long term (current) use of aspirin: Secondary | ICD-10-CM | POA: Insufficient documentation

## 2016-12-22 DIAGNOSIS — R0602 Shortness of breath: Secondary | ICD-10-CM | POA: Diagnosis not present

## 2016-12-22 DIAGNOSIS — I2510 Atherosclerotic heart disease of native coronary artery without angina pectoris: Secondary | ICD-10-CM | POA: Diagnosis not present

## 2016-12-22 DIAGNOSIS — M549 Dorsalgia, unspecified: Secondary | ICD-10-CM | POA: Diagnosis not present

## 2016-12-22 DIAGNOSIS — I10 Essential (primary) hypertension: Secondary | ICD-10-CM | POA: Diagnosis not present

## 2016-12-22 DIAGNOSIS — R05 Cough: Secondary | ICD-10-CM | POA: Diagnosis not present

## 2016-12-22 HISTORY — DX: Essential (primary) hypertension: I10

## 2016-12-22 LAB — CBC
HCT: 39.4 % (ref 39.0–52.0)
Hemoglobin: 13.1 g/dL (ref 13.0–17.0)
MCH: 30.5 pg (ref 26.0–34.0)
MCHC: 33.2 g/dL (ref 30.0–36.0)
MCV: 91.6 fL (ref 78.0–100.0)
Platelets: 204 10*3/uL (ref 150–400)
RBC: 4.3 MIL/uL (ref 4.22–5.81)
RDW: 13.1 % (ref 11.5–15.5)
WBC: 7.1 10*3/uL (ref 4.0–10.5)

## 2016-12-22 LAB — BASIC METABOLIC PANEL
Anion gap: 9 (ref 5–15)
BUN: 8 mg/dL (ref 6–20)
CO2: 26 mmol/L (ref 22–32)
Calcium: 9 mg/dL (ref 8.9–10.3)
Chloride: 106 mmol/L (ref 101–111)
Creatinine, Ser: 0.75 mg/dL (ref 0.61–1.24)
GFR calc Af Amer: >60 mL/min (ref >60)
GFR calc non Af Amer: >60 mL/min (ref >60)
Glucose, Bld: 113 mg/dL — ABNORMAL HIGH (ref 65–99)
Potassium: 3.9 mmol/L (ref 3.5–5.1)
Sodium: 141 mmol/L (ref 135–145)

## 2016-12-22 LAB — I-STAT TROPONIN, ED: Troponin i, poc: 0.01 ng/mL (ref 0.00–0.08)

## 2016-12-22 LAB — D-DIMER, QUANTITATIVE: D-Dimer, Quant: 0.71 ug/mL-FEU — ABNORMAL HIGH (ref 0.00–0.50)

## 2016-12-22 LAB — BRAIN NATRIURETIC PEPTIDE: B NATRIURETIC PEPTIDE 5: 478.8 pg/mL — AB (ref 0.0–100.0)

## 2016-12-22 MED ORDER — POTASSIUM CHLORIDE CRYS ER 20 MEQ PO TBCR
40.0000 meq | EXTENDED_RELEASE_TABLET | Freq: Once | ORAL | Status: AC
  Administered 2016-12-22: 40 meq via ORAL
  Filled 2016-12-22: qty 2

## 2016-12-22 MED ORDER — FUROSEMIDE 20 MG PO TABS: 20.0000 mg | ORAL_TABLET | Freq: Two times a day (BID) | ORAL | 0 refills | Status: DC

## 2016-12-22 MED ORDER — FUROSEMIDE 10 MG/ML IJ SOLN
40.0000 mg | Freq: Once | INTRAMUSCULAR | Status: AC
  Administered 2016-12-22: 40 mg via INTRAVENOUS
  Filled 2016-12-22: qty 4

## 2016-12-22 MED ORDER — POTASSIUM CHLORIDE ER 10 MEQ PO TBCR: 20.0000 meq | EXTENDED_RELEASE_TABLET | Freq: Two times a day (BID) | ORAL | 0 refills | Status: DC

## 2016-12-22 MED ORDER — IOPAMIDOL (ISOVUE-370) INJECTION 76%
INTRAVENOUS | Status: AC
  Administered 2016-12-22: 75 mL
  Filled 2016-12-22: qty 100

## 2016-12-22 NOTE — ED Notes (Signed)
Patient transported back from CT 

## 2016-12-22 NOTE — ED Notes (Signed)
Patient transported to MRI 

## 2016-12-22 NOTE — ED Provider Notes (Signed)
[name] DEPT Provider Note   CSN: [id number] Arrival date & time: 12/22/16  1318     History   Chief Complaint Chief Complaint  Patient presents with  . Shortness of Breath    HPI [name] is a 71 y.o. male with a history of CAD, MI, status post coronary bypass in 2007, presenting with 1 week of increased shortness of breath on exertion from baseline, dry cough, occasional chest pressure and mid scapular discomfort. Patient attributes his shortness of breath to deconditioning and his scapular discomfort to muscular exertion. He was sent to the emergency department from [facility] due to EKG changes. He states that he has not been smoking since 2007 after his MI. He denies a history of DVT/PE, malignancy, prolonged immobilization, or hemoptysis.He denies any cold symptoms, fever, chills, nausea, vomiting, diarrhea.  HPI  Past Medical History:  Diagnosis Date  . Coronary artery disease   . Dyslipidemia   . Hypertension   . Low back pain   . PAC (premature atrial contraction)   . Paroxysmal atrial flutter [facility])     Patient Active Problem List   Diagnosis Date Noted  . Coronary artery disease   . PAC (premature atrial contraction)   . Paroxysmal atrial flutter ([place])   . Dyslipidemia   . Low back pain     Past Surgical History:  Procedure Laterality Date  . CORONARY ARTERY BYPASS GRAFT  2007  . MAZE  2007   MAZE PROCEDURE  . TONSILLECTOMY         Home Medications    Prior to Admission medications   Medication Sig Start Date End Date Taking? Authorizing Provider  Ascorbic Acid (VITAMIN C) 1000 MG tablet Take 1,000 mg by mouth daily.     Yes Historical Provider, MD  aspirin 81 MG tablet Take 81 mg by mouth daily.     Yes Historical Provider, MD  atorvastatin (LIPITOR) 40 MG tablet Take 0.5 tablets (20 mg total) by mouth daily. 08/11/11  Yes Peter M Martinique, MD  carvedilol (COREG) 6.25 MG tablet Take 1 tablet (6.25 mg total) by mouth 2 (two) times  daily. 03/15/11  Yes Peter M Martinique, MD  furosemide (LASIX) 20 MG tablet Take 1 tablet (20 mg total) by mouth 2 (two) times daily. 12/22/16 12/26/16  Emeline General, PA-C  potassium chloride (K-DUR) 10 MEQ tablet Take 2 tablets (20 mEq total) by mouth 2 (two) times daily. 12/22/16 12/26/16  Emeline General, PA-C    Family History Family History  Problem Relation Age of Onset  . Diabetes Mother   . Heart disease Father     Social History Social History  Substance Use Topics  . Smoking status: Former Smoker    Packs/day: 1.00    Years: 30.00    Types: Cigarettes    Quit date: 11/13/2005  . Smokeless tobacco: Never Used  . Alcohol use No     Allergies   Patient has no known allergies.   Review of Systems Review of Systems  Constitutional: Negative for chills and fever.  HENT: Negative for congestion, ear pain, sinus pain, sinus pressure and sore throat.   Eyes: Negative for pain and visual disturbance.  Respiratory: Positive for cough and shortness of breath. Negative for chest tightness, wheezing and stridor.   Cardiovascular: Positive for leg swelling. Negative for chest pain and palpitations.  Gastrointestinal: Negative for abdominal distention, abdominal pain, blood in stool, diarrhea, nausea and vomiting.  Genitourinary: Negative for difficulty urinating, dysuria,  flank pain and hematuria.  Musculoskeletal: Negative for arthralgias, back pain, gait problem, myalgias, neck pain and neck stiffness.  Skin: Negative for color change, pallor and rash.  Neurological: Negative for dizziness, seizures, syncope and light-headedness.  All other systems reviewed and are negative.    Physical Exam Updated Vital Signs BP 132/74   Pulse 68   Temp 98.5 F (36.9 C)   Resp 15   Ht 5\' 7"  (1.702 m)   Wt 99.3 kg   SpO2 93%   BMI 34.30 kg/m   Physical Exam  Constitutional: He appears well-developed and well-nourished. No distress.  Afebrile, nontoxic-appearing, sitting  comfortably in bed in no acute distress. Patient is smiling and well-appearing.  HENT:  Head: Normocephalic and atraumatic.  Eyes: Conjunctivae and EOM are normal.  Neck: Normal range of motion. Neck supple.  Cardiovascular: Normal rate, regular rhythm, normal heart sounds and intact distal pulses.   No murmur heard. Pulmonary/Chest: Effort normal and breath sounds normal. No respiratory distress. He has no wheezes. He has no rales. He exhibits no tenderness.  Abdominal: Soft. He exhibits no distension. There is no tenderness. There is no guarding.  Musculoskeletal: He exhibits edema. He exhibits no tenderness.  Patient has bilateral 1+ pitting edema. No tenderness to both calves bilaterally  Neurological: He is alert.  Skin: Skin is warm and dry. Capillary refill takes less than 2 seconds. He is not diaphoretic. No pallor.  Psychiatric: He has a normal mood and affect.  Nursing note and vitals reviewed.    ED Treatments / Results  Labs (all labs ordered are listed, but only abnormal results are displayed) Labs Reviewed  BASIC METABOLIC PANEL - Abnormal; Notable for the following:       Result Value   Glucose, Bld 113 (*)    All other components within normal limits  BRAIN NATRIURETIC PEPTIDE - Abnormal; Notable for the following:    B Natriuretic Peptide 478.8 (*)    All other components within normal limits  D-DIMER, QUANTITATIVE (NOT AT [facility]) - Abnormal; Notable for the following:    D-Dimer, Quant 0.71 (*)    All other components within normal limits  CBC  I-STAT TROPOININ, ED    EKG  EKG Interpretation  Date/Time:  Friday December 22 2016 13:20:20 EST Ventricular Rate:  76 PR Interval:    QRS Duration: 110 QT Interval:  448 QTC Calculation: 504 R Axis:   57 Text Interpretation:  Sinus rhythm Ventricular premature complex Prolonged QT interval Confirmed by Wilson Singer  MD, STEPHEN [phone]) on 12/22/2016 2:45:46 PM       Radiology Dg Chest 2 View  Result Date:  12/22/2016 CLINICAL DATA:  Evaluation for EKG changes. Patient states dry cough for 2-3 days and chest congestion. Denies chest pain. Currently has no complaints.SENT BY CARDIOLOGIST. EXAM: CHEST - 2 VIEW COMPARISON:  With 08/02/2011 FINDINGS: Previous CABG. Mild perihilar bronchovascular prominence since previous exam suggesting pulmonary vascular congestion, without focal infiltrate or overt edema. Heart size upper limits normal. Small bilateral pleural effusions. Fluid in or thickening of the interlobar fissures. Anterior vertebral endplate spurring at multiple levels in the mid and lower thoracic spine. IMPRESSION: 1. Mild pulmonary vascular congestion with small bilateral pleural effusions. Electronically Signed   By: Lucrezia Europe M.D.   On: 12/22/2016 13:57   Ct Angio Chest Pe W And/or Wo Contrast  Result Date: 12/22/2016 CLINICAL DATA:  Shortness of breast since 0300 hours, cough, dropping saturations, elevated D-dimer, history coronary artery disease post CABG, hypertension,  former smoker, paroxysmal atrial fibrillation EXAM: CT ANGIOGRAPHY CHEST WITH CONTRAST TECHNIQUE: Multidetector CT imaging of the chest was performed using the standard protocol during bolus administration of intravenous contrast. Multiplanar CT image reconstructions and MIPs were obtained to evaluate the vascular anatomy. CONTRAST:  75 cc Isovue 370 IV COMPARISON:  None. FINDINGS: Cardiovascular: Atherosclerotic calcifications aorta, coronary arteries, and proximal great vessels. Postsurgical changes of CABG. Minimal aneurysmal dilatation of the ascending thoracic aorta 4.0 cm transverse. Pulmonary arteries well opacified an patent. No evidence of pulmonary embolism. Enlargement of cardiac chambers without pericardial effusion. Mediastinum/Nodes: Scattered normal size thoracic nodes without adenopathy. Esophagus unremarkable. No hiatal hernia. Visualized base of cervical region normal appearance. Lungs/Pleura: Minimal pleural effusions  bilaterally greater on RIGHT. Central peribronchial thickening. Atelectasis dependently in the posteromedial RIGHT lower lobe. Scattered subpleural blebs. No acute infiltrate or pneumothorax. 5 mm subpleural nodule RIGHT upper lobe image 48. Additional subpleural nodule 4 mm diameter RIGHT upper lobe image 71. Upper Abdomen: Unremarkable Musculoskeletal: Demineralized with scattered degenerative disc disease changes thoracic spine Review of the MIP images confirms the above findings. IMPRESSION: No evidence of pulmonary embolism. Coronary arterial calcification post CABG. Minimal pleural effusions and RIGHT basilar atelectasis. Aortic atherosclerosis with aneurysmal dilatation of the ascending thoracic aorta 4.0 cm transverse, recommendation below. Recommend annual imaging followup by CTA or MRA. This recommendation follows 2010 ACCF/AHA/AATS/ACR/ASA/SCA/SCAI/SIR/STS/SVM Guidelines for the Diagnosis and Management of Patients with Thoracic Aortic Disease. Circulation. 2010; 121: [id number] Two RIGHT lung pulmonary nodules largest 5 mm diameter, recommendation below. No follow-up needed if patient is low-risk (and has no known or suspected primary neoplasm). Non-contrast chest CT can be considered in 12 months if patient is high-risk. This recommendation follows the consensus statement: Guidelines for Management of Incidental Pulmonary Nodules Detected on CT Images: From the Fleischner Society 2017; Radiology 2017; 284:228-243. Electronically Signed   By: Lavonia Dana M.D.   On: 12/22/2016 16:13    Procedures Procedures (including critical care time)  Medications Ordered in ED Medications  iopamidol (ISOVUE-370) 76 % injection (75 mLs  Contrast Given 12/22/16 1556)  furosemide (LASIX) injection 40 mg (40 mg Intravenous Given 12/22/16 1636)  potassium chloride SA (K-DUR,KLOR-CON) CR tablet 40 mEq (40 mEq Oral Given 12/22/16 1636)     Initial Impression / Assessment and Plan / ED Course  I have reviewed the  triage vital signs and the nursing notes.  Pertinent labs & imaging results that were available during my care of the patient were reviewed by me and considered in my medical decision making (see chart for details).     71 y/o male presenting with dry cough and sob on exertion. Patient is currently asymptomatic. He denies any chest pain or pressure, nausea, vomiting, palpitations or any discomfort. He says that he was sent here because they noticed some EKG changes at [facility]. Patient is not tachycardic, hypoxic, no history of DVT/PE, malignancy or recent prolonged immobilization.   Dimer Elevated BNP Elevated  Reviewed EKGs  with Dr. Wilson Singer and noted mild elevation QTC and isolated PVCs but otherwise appear unchanged from prior tracing available in epic. Reviewed chest x-ray with Dr. Wilson Singer and noted peribronchial thickening and mild effusion.  On exam lungs are clear and equal bilaterally normal heart sounds. He is afebrile, nontoxic-appearing, sitting comfortably in bed in no acute distress.  Patient with history of CAD and bypass now with increased shortness of breath on exertion.  He was stable and well during his stay in the emergency department O2 sats were maintained  when ambulated.  Findings consistent with likely congestive heart failure and emphasized the need to follow up with cardiology for further evaluation.   Discharge home with Lasix, potassium and close follow-up with cardiology. He has been seeing Dr. Peter Martinique in the past and would like to see him again. He will be calling first thing Monday morning.   Had an extensive  discussion with patient  and family Regarding incidental findings on CT including 4 cm aortic aneurysm and pulmonary nodules. Patient and family understood the need for imaging in a year and will follow up with Dr. Alyson Ingles their primary care provider.Provided patient with CT results to take home.  Discussed strict return precautions. Patient was advised to  return to the emergency department if experiencing any new or worsening symptoms. Patient and wife clearly understood instructions and agreed with discharge plan.  Patient was discussed with Dr. Wilson Singer who also has seen patient and agrees with assessment and plan.  Final Clinical Impressions(s) / ED Diagnoses   Final diagnoses:  Dyspnea, unspecified type    New Prescriptions New Prescriptions   FUROSEMIDE (LASIX) 20 MG TABLET    Take 1 tablet (20 mg total) by mouth 2 (two) times daily.   POTASSIUM CHLORIDE (K-DUR) 10 MEQ TABLET    Take 2 tablets (20 mEq total) by mouth 2 (two) times daily.     Emeline General, PA-C 12/22/16 1849    Virgel Manifold, MD 01/01/17 [phone]

## 2016-12-22 NOTE — Discharge Instructions (Signed)
Please return to the emergency department immediately if you experience chest pressure, pain, increased shortness of breath, dizziness, palpitations, or any worsening or new concerning symptoms.  As discussed please follow-up with your cardiologist first thing Monday morning for follow-up. Follow-up with your primary care provider regarding incidental CT scan findings and need for imaging in a year.  I provided you with some instructions on low salt diet and information on aortic aneurysm and pulmonary nodules. Please limit your salt intake

## 2016-12-22 NOTE — ED Notes (Signed)
Patient transported to CT 

## 2016-12-22 NOTE — ED Triage Notes (Signed)
Arrived via EMS from [facility] for evaluation for EKG changes. Patient states dry cough for 2-3 days and chest congestion. Denies chest pain. Currently has no complaints.

## 2017-01-05 ENCOUNTER — Encounter: Payer: Self-pay | Admitting: Cardiology

## 2017-01-05 ENCOUNTER — Ambulatory Visit (INDEPENDENT_AMBULATORY_CARE_PROVIDER_SITE_OTHER): Payer: PPO | Admitting: Cardiology

## 2017-01-05 VITALS — BP 134/70 | HR 72 | Ht 67.0 in | Wt 213.0 lb

## 2017-01-05 DIAGNOSIS — I25709 Atherosclerosis of coronary artery bypass graft(s), unspecified, with unspecified angina pectoris: Secondary | ICD-10-CM

## 2017-01-05 DIAGNOSIS — E785 Hyperlipidemia, unspecified: Secondary | ICD-10-CM | POA: Diagnosis not present

## 2017-01-05 DIAGNOSIS — I714 Abdominal aortic aneurysm, without rupture, unspecified: Secondary | ICD-10-CM | POA: Insufficient documentation

## 2017-01-05 DIAGNOSIS — I493 Ventricular premature depolarization: Secondary | ICD-10-CM

## 2017-01-05 DIAGNOSIS — I712 Thoracic aortic aneurysm, without rupture, unspecified: Secondary | ICD-10-CM | POA: Insufficient documentation

## 2017-01-05 MED ORDER — POTASSIUM CHLORIDE CRYS ER 20 MEQ PO TBCR: 20.0000 meq | EXTENDED_RELEASE_TABLET | Freq: Every day | ORAL | 3 refills | Status: DC

## 2017-01-05 MED ORDER — FUROSEMIDE 20 MG PO TABS: 20.0000 mg | ORAL_TABLET | Freq: Every day | ORAL | 3 refills | Status: DC

## 2017-01-05 NOTE — Patient Instructions (Signed)
Resume lasix 20 mg daily and potassium 20 meq daily  Continue your other therapy  We will request records of lab work and abdominal ultrasound from Dr. Alyson Ingles  We will look for your old cardiology records  We will schedule you for and Echocardiogram and a nuclear stress test

## 2017-01-05 NOTE — Progress Notes (Addendum)
[name] Date of Birth: Jul 16, 1946   History of Present Illness: [name] is seen for  followup CAD. Referred by Dr. Alyson Ingles.He was last seen in March 2013. He has a history of coronary disease and is status post CABG with a Maze procedure in 2007 by Dr. Prescott Gum. Prior to CABG EF was 35-40%. CABG included an LIMA graft to LAD, saphenous vein graft to the obtuse marginal vessel, and saphenous vein graft sequentially to the PDA and posterior lateral branches of the right coronary. He quit smoking in 2007. Patient reports a follow up stress test post CABG but cannot remember results and no documentation in E chart. He was seen recently in the ED on 12/22/16 for symptoms of dyspnea on exertion, dry cough, chest pain and mid scapular pain. Ecg showed NSR with PVCs, prolonged QT. No acute ischemic changes. He has some edema. BNP elevated at 489. CXR showed mild vascular congestion and effusions. CTA showed no pulmonary embolus. There was enlargement of the thoracic aorta to 4 cm. He was sent home on lasix and potassium and recommended follow up here.   He took lasix for 4 days. Noted a weight loss of 13 lbs but after lasix stopped he gained 10 lbs back. States breathing is improved. No chest pain. BP has been under control. He reports history of AAA noted on [place]. Size unknown. He was laid off 7 years ago from a [employer] and now works several odd jobs.   Current Outpatient Prescriptions on File Prior to Visit  Medication Sig Dispense Refill  . Ascorbic Acid (VITAMIN C) 1000 MG tablet Take 1,000 mg by mouth daily.      Marland Kitchen aspirin 81 MG tablet Take 81 mg by mouth daily.      Marland Kitchen atorvastatin (LIPITOR) 40 MG tablet Take 0.5 tablets (20 mg total) by mouth daily. 30 tablet 5  . carvedilol (COREG) 6.25 MG tablet Take 1 tablet (6.25 mg total) by mouth 2 (two) times daily. 60 tablet 11   No current facility-administered medications on file prior to visit.     No Known Allergies  Past Medical  History:  Diagnosis Date  . AAA (abdominal aortic aneurysm) ([place])   . CAD (coronary artery disease) of artery bypass graft   . Coronary artery disease   . Dyslipidemia   . HTN (hypertension)   . Hyperlipidemia   . Hypertension   . Low back pain   . PAC (premature atrial contraction)   . Paroxysmal atrial flutter [facility])     Past Surgical History:  Procedure Laterality Date  . CORONARY ARTERY BYPASS GRAFT  2007  . CORONARY ARTERY BYPASS GRAFT    . LUMBAR DISC ARTHROPLASTY    . MAZE  2007   MAZE PROCEDURE  . TONSILLECTOMY      History  Smoking Status  . Former Smoker  . Packs/day: 1.00  . Years: 30.00  . Types: Cigarettes  . Quit date: 11/13/2005  Smokeless Tobacco  . Never Used    History  Alcohol Use No    Family History  Problem Relation Age of Onset  . Diabetes Mother   . Heart disease Father     Review of Systems: As noted in history of present illness. All other systems are reviewed and are negative.   Physical Exam: BP 134/70 (BP Location: Right Arm, Cuff Size: Normal)   Pulse 72   Ht 5\' 7"  (1.702 m)   Wt 213 lb (96.6 kg)   BMI  33.36 kg/m  He is a pleasant obese white male in no acute distress. He has no JVD or bruits. There is no adenopathy. Lungs are clear. Cardiac exam reveals an irregular rate without gallop or murmur. Abdomen is obese, soft, nontender. He has no edema. Pedal pulses are good. He is alert oriented x3. Cranial nerves II through XII are intact.  LABORATORY DATA: Lab Results  Component Value Date   WBC 7.1 12/22/2016   HGB 13.1 12/22/2016   HCT 39.4 12/22/2016   PLT 204 12/22/2016   GLUCOSE 113 (H) 12/22/2016   CHOL 145 05/10/2012   TRIG 70.0 05/10/2012   HDL 50.20 05/10/2012   LDLDIRECT 124.1 02/07/2012   LDLCALC 81 05/10/2012   ALT 22 05/10/2012   AST 23 05/10/2012   NA 141 12/22/2016   K 3.9 12/22/2016   CL 106 12/22/2016   CREATININE 0.75 12/22/2016   BUN 8 12/22/2016   CO2 26 12/22/2016   INR 0.96 08/02/2011   Labs  dated 05/24/16: cholesterol 155, triglycerides 124, HDL 48, LDL 82.   Ecg 12/22/16: NSR rate 76, occ. PVC, prolonged QTc 504 msec. I have personally reviewed and interpreted this study.  Study Result   CLINICAL DATA:  Shortness of breast since 0300 hours, cough, dropping saturations, elevated D-dimer, history coronary artery disease post CABG, hypertension, former smoker, paroxysmal atrial fibrillation  EXAM: CT ANGIOGRAPHY CHEST WITH CONTRAST  TECHNIQUE: Multidetector CT imaging of the chest was performed using the standard protocol during bolus administration of intravenous contrast. Multiplanar CT image reconstructions and MIPs were obtained to evaluate the vascular anatomy.  CONTRAST:  75 cc Isovue 370 IV  COMPARISON:  None.  FINDINGS: Cardiovascular: Atherosclerotic calcifications aorta, coronary arteries, and proximal great vessels. Postsurgical changes of CABG. Minimal aneurysmal dilatation of the ascending thoracic aorta 4.0 cm transverse. Pulmonary arteries well opacified an patent. No evidence of pulmonary embolism. Enlargement of cardiac chambers without pericardial effusion.  Mediastinum/Nodes: Scattered normal size thoracic nodes without adenopathy. Esophagus unremarkable. No hiatal hernia. Visualized base of cervical region normal appearance.  Lungs/Pleura: Minimal pleural effusions bilaterally greater on RIGHT. Central peribronchial thickening. Atelectasis dependently in the posteromedial RIGHT lower lobe. Scattered subpleural blebs. No acute infiltrate or pneumothorax. 5 mm subpleural nodule RIGHT upper lobe image 48. Additional subpleural nodule 4 mm diameter RIGHT upper lobe image 71.  Upper Abdomen: Unremarkable  Musculoskeletal: Demineralized with scattered degenerative disc disease changes thoracic spine  Review of the MIP images confirms the above findings.  IMPRESSION: No evidence of pulmonary embolism.  Coronary arterial  calcification post CABG.  Minimal pleural effusions and RIGHT basilar atelectasis.  Aortic atherosclerosis with aneurysmal dilatation of the ascending thoracic aorta 4.0 cm transverse, recommendation below.  Recommend annual imaging followup by CTA or MRA. This recommendation follows 2010 ACCF/AHA/AATS/ACR/ASA/SCA/SCAI/SIR/STS/SVM Guidelines for the Diagnosis and Management of Patients with Thoracic Aortic Disease. Circulation. 2010; 121: [id number]  Two RIGHT lung pulmonary nodules largest 5 mm diameter, recommendation below.  No follow-up needed if patient is low-risk (and has no known or suspected primary neoplasm). Non-contrast chest CT can be considered in 12 months if patient is high-risk. This recommendation follows the consensus statement: Guidelines for Management of Incidental Pulmonary Nodules Detected on CT Images: From the Fleischner Society 2017; Radiology 2017; 284:228-243.   Electronically Signed   By: Lavonia Dana M.D.   On: 12/22/2016 16:13   Study Result   CLINICAL DATA:  Evaluation for EKG changes. Patient states dry cough for 2-3 days and chest congestion. Denies chest pain.  Currently has no complaints.SENT BY CARDIOLOGIST.  EXAM: CHEST - 2 VIEW  COMPARISON:  With 08/02/2011  FINDINGS: Previous CABG. Mild perihilar bronchovascular prominence since previous exam suggesting pulmonary vascular congestion, without focal infiltrate or overt edema.  Heart size upper limits normal.  Small bilateral pleural effusions. Fluid in or thickening of the interlobar fissures.  Anterior vertebral endplate spurring at multiple levels in the mid and lower thoracic spine.  IMPRESSION: 1. Mild pulmonary vascular congestion with small bilateral pleural effusions.   Electronically Signed   By: Lucrezia Europe M.D.   On: 12/22/2016 13:57        Assessment / Plan: 1. Acute CHF recent onset. Significant improvement with lasix but now regaining  weight. EF unknown. Will resume lasix 20 mg daily. Potassium 20 meq daily. Continue Coreg. Will arrange for Echo to assess LV function. Will need to consider additional therapy with ACEi/ARB/aldactone depending on findings of Echo. Will arrange for follow up fasting labs and BNP.  2. CAD s/p CABG 2007. On ASA, statin, beta blocker. With recent CHF symptoms will arrange for stress myoview to evaluate for ischemia. Requested old paper chart for prior testing results.  3. Hyperlipidemia on statin. Follow up lab work  4. HTN controlled.  5. AAA. ? Size- request copy of ultrasound.  6. Mild thoracic aneurysm. Recommend CT yearly.   Follow up after above studies done.

## 2017-01-08 DIAGNOSIS — I25709 Atherosclerosis of coronary artery bypass graft(s), unspecified, with unspecified angina pectoris: Secondary | ICD-10-CM | POA: Diagnosis not present

## 2017-01-08 DIAGNOSIS — I714 Abdominal aortic aneurysm, without rupture: Secondary | ICD-10-CM | POA: Diagnosis not present

## 2017-01-08 DIAGNOSIS — I712 Thoracic aortic aneurysm, without rupture: Secondary | ICD-10-CM | POA: Diagnosis not present

## 2017-01-08 DIAGNOSIS — I493 Ventricular premature depolarization: Secondary | ICD-10-CM | POA: Diagnosis not present

## 2017-01-08 DIAGNOSIS — R0600 Dyspnea, unspecified: Secondary | ICD-10-CM | POA: Diagnosis not present

## 2017-01-08 DIAGNOSIS — E785 Hyperlipidemia, unspecified: Secondary | ICD-10-CM | POA: Diagnosis not present

## 2017-01-09 LAB — HEPATIC FUNCTION PANEL
ALK PHOS: 79 U/L (ref 40–115)
ALT: 25 U/L (ref 9–46)
AST: 21 U/L (ref 10–35)
Albumin: 4 g/dL (ref 3.6–5.1)
BILIRUBIN DIRECT: 0.2 mg/dL (ref <=0.2)
BILIRUBIN TOTAL: 0.7 mg/dL (ref 0.2–1.2)
Indirect Bilirubin: 0.5 mg/dL (ref 0.2–1.2)
TOTAL PROTEIN: 6.7 g/dL (ref 6.1–8.1)

## 2017-01-09 LAB — BASIC METABOLIC PANEL
BUN: 14 mg/dL (ref 7–25)
CHLORIDE: 105 mmol/L (ref 98–110)
CO2: 30 mmol/L (ref 20–31)
Calcium: 9.1 mg/dL (ref 8.6–10.3)
Creat: 0.77 mg/dL (ref 0.70–1.18)
GLUCOSE: 103 mg/dL — AB (ref 65–99)
POTASSIUM: 4.6 mmol/L (ref 3.5–5.3)
SODIUM: 140 mmol/L (ref 135–146)

## 2017-01-09 LAB — LIPID PANEL
CHOL/HDL RATIO: 3 ratio (ref <5.0)
Cholesterol: 138 mg/dL (ref <200)
HDL: 46 mg/dL (ref >40)
LDL Cholesterol: 74 mg/dL (ref <100)
Triglycerides: 88 mg/dL (ref <150)
VLDL: 18 mg/dL (ref <30)

## 2017-01-10 LAB — BRAIN NATRIURETIC PEPTIDE: Brain Natriuretic Peptide: 136.9 pg/mL — ABNORMAL HIGH (ref <100)

## 2017-01-15 ENCOUNTER — Telehealth: Payer: Self-pay | Admitting: Cardiology

## 2017-01-15 NOTE — Telephone Encounter (Signed)
Called the patient and left my name and number to call me back to schedule his stress myoview.

## 2017-01-16 ENCOUNTER — Telehealth (HOSPITAL_COMMUNITY): Payer: Self-pay | Admitting: *Deleted

## 2017-01-16 NOTE — Telephone Encounter (Signed)
Patient given detailed instructions per Myocardial Perfusion Study Information Sheet for the test on 01/17/17. Patient notified to arrive 15 minutes early and that it is imperative to arrive on time for appointment to keep from having the test rescheduled.  If you need to cancel or reschedule your appointment, please call the office within 24 hours of your appointment. Failure to do so may result in a cancellation of your appointment, and a $50 no show fee. Patient verbalized understanding. Kirstie Peri

## 2017-01-17 ENCOUNTER — Ambulatory Visit (HOSPITAL_COMMUNITY): Payer: PPO | Attending: Cardiology

## 2017-01-17 DIAGNOSIS — I714 Abdominal aortic aneurysm, without rupture, unspecified: Secondary | ICD-10-CM

## 2017-01-17 DIAGNOSIS — I712 Thoracic aortic aneurysm, without rupture, unspecified: Secondary | ICD-10-CM

## 2017-01-17 DIAGNOSIS — R9439 Abnormal result of other cardiovascular function study: Secondary | ICD-10-CM | POA: Diagnosis not present

## 2017-01-17 DIAGNOSIS — I493 Ventricular premature depolarization: Secondary | ICD-10-CM

## 2017-01-17 DIAGNOSIS — I2510 Atherosclerotic heart disease of native coronary artery without angina pectoris: Secondary | ICD-10-CM | POA: Diagnosis not present

## 2017-01-17 DIAGNOSIS — I25709 Atherosclerosis of coronary artery bypass graft(s), unspecified, with unspecified angina pectoris: Secondary | ICD-10-CM | POA: Diagnosis not present

## 2017-01-17 DIAGNOSIS — R0609 Other forms of dyspnea: Secondary | ICD-10-CM | POA: Diagnosis not present

## 2017-01-17 DIAGNOSIS — R079 Chest pain, unspecified: Secondary | ICD-10-CM | POA: Diagnosis not present

## 2017-01-17 DIAGNOSIS — Z951 Presence of aortocoronary bypass graft: Secondary | ICD-10-CM | POA: Diagnosis not present

## 2017-01-17 DIAGNOSIS — E785 Hyperlipidemia, unspecified: Secondary | ICD-10-CM

## 2017-01-17 DIAGNOSIS — I5021 Acute systolic (congestive) heart failure: Secondary | ICD-10-CM | POA: Insufficient documentation

## 2017-01-17 DIAGNOSIS — I110 Hypertensive heart disease with heart failure: Secondary | ICD-10-CM | POA: Diagnosis not present

## 2017-01-17 LAB — MYOCARDIAL PERFUSION IMAGING
CHL CUP MPHR: 150 {beats}/min
CHL CUP NUCLEAR SSS: 11
CHL CUP RESTING HR STRESS: 74 {beats}/min
CHL RATE OF PERCEIVED EXERTION: 18
CSEPED: 5 min
CSEPEDS: 59 s
CSEPHR: 92 %
Estimated workload: 7 METS
LV sys vol: 74 mL
LVDIAVOL: 136 mL (ref 62–150)
Peak HR: 139 {beats}/min
RATE: 0.42
SDS: 4
SRS: 7
TID: 0.9

## 2017-01-17 MED ORDER — TECHNETIUM TC 99M TETROFOSMIN IV KIT
32.6000 | PACK | Freq: Once | INTRAVENOUS | Status: AC | PRN
  Administered 2017-01-17: 32.6 via INTRAVENOUS
  Filled 2017-01-17: qty 33

## 2017-01-17 MED ORDER — TECHNETIUM TC 99M TETROFOSMIN IV KIT
10.7000 | PACK | Freq: Once | INTRAVENOUS | Status: AC | PRN
  Administered 2017-01-17: 10.7 via INTRAVENOUS
  Filled 2017-01-17: qty 11

## 2017-01-29 ENCOUNTER — Other Ambulatory Visit: Payer: Self-pay

## 2017-01-29 ENCOUNTER — Ambulatory Visit (HOSPITAL_COMMUNITY): Payer: PPO | Attending: Cardiovascular Disease

## 2017-01-29 DIAGNOSIS — I10 Essential (primary) hypertension: Secondary | ICD-10-CM | POA: Insufficient documentation

## 2017-01-29 DIAGNOSIS — I480 Paroxysmal atrial fibrillation: Secondary | ICD-10-CM | POA: Diagnosis not present

## 2017-01-29 DIAGNOSIS — I493 Ventricular premature depolarization: Secondary | ICD-10-CM | POA: Diagnosis not present

## 2017-01-29 DIAGNOSIS — I714 Abdominal aortic aneurysm, without rupture, unspecified: Secondary | ICD-10-CM

## 2017-01-29 DIAGNOSIS — I712 Thoracic aortic aneurysm, without rupture, unspecified: Secondary | ICD-10-CM

## 2017-01-29 DIAGNOSIS — I25709 Atherosclerosis of coronary artery bypass graft(s), unspecified, with unspecified angina pectoris: Secondary | ICD-10-CM | POA: Diagnosis not present

## 2017-01-29 DIAGNOSIS — E785 Hyperlipidemia, unspecified: Secondary | ICD-10-CM

## 2017-01-31 ENCOUNTER — Other Ambulatory Visit: Payer: Self-pay

## 2017-01-31 DIAGNOSIS — I25709 Atherosclerosis of coronary artery bypass graft(s), unspecified, with unspecified angina pectoris: Secondary | ICD-10-CM

## 2017-01-31 DIAGNOSIS — I4892 Unspecified atrial flutter: Secondary | ICD-10-CM

## 2017-01-31 MED ORDER — LOSARTAN POTASSIUM 25 MG PO TABS: 25.0000 mg | ORAL_TABLET | Freq: Every day | ORAL | 6 refills | Status: DC

## 2017-02-09 DIAGNOSIS — I25709 Atherosclerosis of coronary artery bypass graft(s), unspecified, with unspecified angina pectoris: Secondary | ICD-10-CM | POA: Diagnosis not present

## 2017-02-09 DIAGNOSIS — I4892 Unspecified atrial flutter: Secondary | ICD-10-CM | POA: Diagnosis not present

## 2017-02-09 LAB — BASIC METABOLIC PANEL
BUN: 14 mg/dL (ref 7–25)
CALCIUM: 8.8 mg/dL (ref 8.6–10.3)
CO2: 26 mmol/L (ref 20–31)
CREATININE: 0.82 mg/dL (ref 0.70–1.18)
Chloride: 104 mmol/L (ref 98–110)
Glucose, Bld: 126 mg/dL — ABNORMAL HIGH (ref 65–99)
POTASSIUM: 4.1 mmol/L (ref 3.5–5.3)
Sodium: 140 mmol/L (ref 135–146)

## 2017-02-13 NOTE — Progress Notes (Signed)
[name] Date of Birth: Oct 29, 1946   History of Present Illness: [name] is seen for  followup CAD. Referred by Dr. Alyson [name].He was last seen in March 2013. He has a history of coronary disease and is status post CABG with a Maze procedure in 2007 by Dr. Prescott [name]. Prior to CABG EF was 35-40%. CABG included an LIMA graft to LAD, saphenous vein graft to the obtuse marginal vessel, and saphenous vein graft sequentially to the PDA and posterior lateral branches of the right coronary. He quit smoking in 2007. Patient reports a follow up stress test post CABG but cannot remember results and no documentation in E chart. He was seen in the ED on 12/22/16 for symptoms of dyspnea on exertion, dry cough, chest pain and mid scapular pain. Ecg showed NSR with PVCs, prolonged QT. No acute ischemic changes. He had some edema. BNP elevated at 489. CXR showed mild vascular congestion and effusions. CTA showed no pulmonary embolus. There was enlargement of the thoracic aorta to 4 cm. He was sent home on lasix and potassium. Follow up Echo showed EF 35-40%. Myoview showed evidence of old inferior infarct. No ischemia. EF 45%. He was started on losartan 25 mg daily.  On follow up today he reports he is doing well. No chest pain, dyspnea, palpitations, orthopnea, or PND. Still quite sedentary. Walks 300 yds 3x/wk. Tolerating medication well. Restricting sodium intake. No increased swelling. Weight stable.  Current Outpatient Prescriptions on File Prior to Visit  Medication Sig Dispense Refill  . Ascorbic Acid (VITAMIN C) 1000 MG tablet Take 1,000 mg by mouth daily.      Marland Kitchen aspirin 81 MG tablet Take 81 mg by mouth daily.      Marland Kitchen atorvastatin (LIPITOR) 40 MG tablet Take 0.5 tablets (20 mg total) by mouth daily. (Patient taking differently: Take 40 mg by mouth daily. ) 30 tablet 5  . carvedilol (COREG) 6.25 MG tablet Take 1 tablet (6.25 mg total) by mouth 2 (two) times daily. 60 tablet 11  . furosemide (LASIX) 20 MG  tablet Take 1 tablet (20 mg total) by mouth daily. 90 tablet 3  . potassium chloride SA (K-DUR,KLOR-CON) 20 MEQ tablet Take 1 tablet (20 mEq total) by mouth daily. 90 tablet 3   No current facility-administered medications on file prior to visit.     No Known Allergies  Past Medical History:  Diagnosis Date  . AAA (abdominal aortic aneurysm) ([place])   . CAD (coronary artery disease) of artery bypass graft   . Coronary artery disease   . Dyslipidemia   . HTN (hypertension)   . Hyperlipidemia   . Hypertension   . Low back pain   . PAC (premature atrial contraction)   . Paroxysmal atrial flutter [facility])     Past Surgical History:  Procedure Laterality Date  . CORONARY ARTERY BYPASS GRAFT  2007  . CORONARY ARTERY BYPASS GRAFT    . LUMBAR DISC ARTHROPLASTY    . MAZE  2007   MAZE PROCEDURE  . TONSILLECTOMY      History  Smoking Status  . Former Smoker  . Packs/day: 1.00  . Years: 30.00  . Types: Cigarettes  . Quit date: 11/13/2005  Smokeless Tobacco  . Never Used    History  Alcohol Use No    Family History  Problem Relation Age of Onset  . Diabetes Mother   . Heart disease Father     Review of Systems: As noted in history of present illness.  All other systems are reviewed and are negative.   Physical Exam: BP 116/64   Pulse 70   Ht 5\' 6"  (1.676 m)   Wt 213 lb (96.6 kg)   BMI 34.38 kg/m  He is a pleasant obese white male in no acute distress. He has no JVD or bruits. There is no adenopathy. Lungs are clear. Cardiac exam reveals an irregular rate without gallop or murmur. Abdomen is obese, soft, nontender. He has no edema. Pedal pulses are good. He is alert oriented x3. Cranial nerves II through XII are intact.  LABORATORY DATA: Lab Results  Component Value Date   WBC 7.1 12/22/2016   HGB 13.1 12/22/2016   HCT 39.4 12/22/2016   PLT 204 12/22/2016   GLUCOSE 126 (H) 02/09/2017   CHOL 138 01/08/2017   TRIG 88 01/08/2017   HDL 46 01/08/2017   LDLDIRECT 124.1  02/07/2012   LDLCALC 74 01/08/2017   ALT 25 01/08/2017   AST 21 01/08/2017   NA 140 02/09/2017   K 4.1 02/09/2017   CL 104 02/09/2017   CREATININE 0.82 02/09/2017   BUN 14 02/09/2017   CO2 26 02/09/2017   INR 0.96 08/02/2011   Labs dated 05/24/16: cholesterol 155, triglycerides 124, HDL 48, LDL 82.   Study Result   Echo 01/29/17: Study Conclusions  - Left ventricle: The cavity size was normal. Wall thickness was   normal. Systolic function was moderately reduced. The estimated   ejection fraction was in the range of 35% to 40%. Moderate   diffuse hypokinesis with distinct regional wall motion   abnormalities. - Mitral valve: Calcified annulus. - Left atrium: The atrium was severely dilated. - Right atrium: The atrium was mildly dilated.  Myoview 01/17/17: Study Highlights     Nuclear stress EF: 45%.  Blood pressure demonstrated a hypertensive response to exercise.  There was no ST segment deviation noted during stress.  Findings consistent with prior myocardial infarction.  This is an intermediate risk study.  The left ventricular ejection fraction is mildly decreased (45-54%).   Small area of inferior wall infarct at mid and basal level no ischemia EF 45% apical hypokinesis Frequent ventricular ectopy during stress and recovery  HTN response to exercise      Assessment / Plan: 1. Chronic combined systolic and diastolic CHF. EF 35-40% by Echo. 45% by Myoview.  Will continue lasix 20 mg daily. Potassium 20 meq daily. Continue Coreg.  Will increase losartan to 50 mg daily. Continue sodium restriction. Stressed importance of daily exercise and weight loss. Follow up in 4 months.  2. CAD s/p CABG 2007. On ASA, statin, beta blocker. Myoview shows inferior infarct without ischemia. He is asymptomatic.   3. Hyperlipidemia on statin.   4. HTN controlled.  5. AAA. 2.9 cm by [place] in 2016- will follow up with yearly CT for both thoracic and aortic aneurysm.  6. Mild  thoracic aneurysm. Recommend CT yearly.

## 2017-02-14 ENCOUNTER — Ambulatory Visit (INDEPENDENT_AMBULATORY_CARE_PROVIDER_SITE_OTHER): Payer: PPO | Admitting: Cardiology

## 2017-02-14 ENCOUNTER — Encounter: Payer: Self-pay | Admitting: Cardiology

## 2017-02-14 VITALS — BP 116/64 | HR 70 | Ht 66.0 in | Wt 213.0 lb

## 2017-02-14 DIAGNOSIS — I5042 Chronic combined systolic (congestive) and diastolic (congestive) heart failure: Secondary | ICD-10-CM | POA: Diagnosis not present

## 2017-02-14 DIAGNOSIS — E785 Hyperlipidemia, unspecified: Secondary | ICD-10-CM | POA: Diagnosis not present

## 2017-02-14 DIAGNOSIS — I25709 Atherosclerosis of coronary artery bypass graft(s), unspecified, with unspecified angina pectoris: Secondary | ICD-10-CM | POA: Diagnosis not present

## 2017-02-14 DIAGNOSIS — I712 Thoracic aortic aneurysm, without rupture, unspecified: Secondary | ICD-10-CM

## 2017-02-14 MED ORDER — LOSARTAN POTASSIUM 50 MG PO TABS: 50.0000 mg | ORAL_TABLET | Freq: Every day | ORAL | 3 refills | Status: DC

## 2017-02-14 NOTE — Patient Instructions (Signed)
Increase losartan to 50 mg daily  Continue your other therapy  Restrict your salt intake  You need to exercise daily and lose weight  We will follow up in 4 months.

## 2017-05-30 DIAGNOSIS — Z1159 Encounter for screening for other viral diseases: Secondary | ICD-10-CM | POA: Diagnosis not present

## 2017-05-30 DIAGNOSIS — I2510 Atherosclerotic heart disease of native coronary artery without angina pectoris: Secondary | ICD-10-CM | POA: Diagnosis not present

## 2017-05-30 DIAGNOSIS — Z125 Encounter for screening for malignant neoplasm of prostate: Secondary | ICD-10-CM | POA: Diagnosis not present

## 2017-05-30 DIAGNOSIS — E782 Mixed hyperlipidemia: Secondary | ICD-10-CM | POA: Diagnosis not present

## 2017-05-30 DIAGNOSIS — I119 Hypertensive heart disease without heart failure: Secondary | ICD-10-CM | POA: Diagnosis not present

## 2017-05-30 DIAGNOSIS — Z0000 Encounter for general adult medical examination without abnormal findings: Secondary | ICD-10-CM | POA: Diagnosis not present

## 2017-05-30 DIAGNOSIS — Z6835 Body mass index (BMI) 35.0-35.9, adult: Secondary | ICD-10-CM | POA: Diagnosis not present

## 2017-05-30 DIAGNOSIS — Z1389 Encounter for screening for other disorder: Secondary | ICD-10-CM | POA: Diagnosis not present

## 2017-05-30 DIAGNOSIS — I5042 Chronic combined systolic (congestive) and diastolic (congestive) heart failure: Secondary | ICD-10-CM | POA: Diagnosis not present

## 2017-05-30 DIAGNOSIS — I77811 Abdominal aortic ectasia: Secondary | ICD-10-CM | POA: Diagnosis not present

## 2017-06-20 DIAGNOSIS — R739 Hyperglycemia, unspecified: Secondary | ICD-10-CM | POA: Diagnosis not present

## 2017-06-24 NOTE — Progress Notes (Signed)
[name] Date of Birth: 1946/04/20   History of Present Illness: [name] is seen for  followup CAD. Referred by Dr. Alyson Ingles.He was last seen in March 2013. He has a history of coronary disease and is status post CABG with a Maze procedure in 2007 by Dr. Prescott Gum. Prior to CABG EF was 35-40%. CABG included an LIMA graft to LAD, saphenous vein graft to the obtuse marginal vessel, and saphenous vein graft sequentially to the PDA and posterior lateral branches of the right coronary. He quit smoking in 2007.   He was seen in the ED on 12/22/16 for symptoms of dyspnea on exertion, dry cough, chest pain and mid scapular pain. Ecg showed NSR with PVCs, prolonged QT. No acute ischemic changes. He had some edema. BNP elevated at 489. CXR showed mild vascular congestion and effusions. CTA showed no pulmonary embolus. There was enlargement of the thoracic aorta to 4 cm. He was sent home on lasix and potassium. Follow up Echo showed EF 35-40%. Myoview showed evidence of old inferior infarct. No ischemia. EF 45%. He was started on losartan 25 mg daily- this was increased on his last visit.  On follow up today he reports he is doing well. No chest pain, dyspnea, palpitations, orthopnea, or PND. Still quite sedentary. Doing some outside housework. Bowling 2 days a week. Planning to start riding his bike some.  Tolerating medication well. Restricting sodium intake. No increased swelling. Weight down 2 lbs.   Current Outpatient Prescriptions on File Prior to Visit  Medication Sig Dispense Refill  . Ascorbic Acid (VITAMIN C) 1000 MG tablet Take 1,000 mg by mouth daily.      Marland Kitchen aspirin 81 MG tablet Take 81 mg by mouth daily.      . carvedilol (COREG) 6.25 MG tablet Take 1 tablet (6.25 mg total) by mouth 2 (two) times daily. 60 tablet 11  . furosemide (LASIX) 20 MG tablet Take 1 tablet (20 mg total) by mouth daily. 90 tablet 3  . losartan (COZAAR) 50 MG tablet Take 1 tablet (50 mg total) by mouth daily. 90 tablet  3  . potassium chloride SA (K-DUR,KLOR-CON) 20 MEQ tablet Take 1 tablet (20 mEq total) by mouth daily. 90 tablet 3   No current facility-administered medications on file prior to visit.     No Known Allergies  Past Medical History:  Diagnosis Date  . AAA (abdominal aortic aneurysm) ([place])   . CAD (coronary artery disease) of artery bypass graft   . Coronary artery disease   . Dyslipidemia   . HTN (hypertension)   . Hyperlipidemia   . Hypertension   . Low back pain   . PAC (premature atrial contraction)   . Paroxysmal atrial flutter [facility])     Past Surgical History:  Procedure Laterality Date  . CORONARY ARTERY BYPASS GRAFT  2007  . CORONARY ARTERY BYPASS GRAFT    . LUMBAR DISC ARTHROPLASTY    . MAZE  2007   MAZE PROCEDURE  . TONSILLECTOMY      History  Smoking Status  . Former Smoker  . Packs/day: 1.00  . Years: 30.00  . Types: Cigarettes  . Quit date: 11/13/2005  Smokeless Tobacco  . Never Used    History  Alcohol Use No    Family History  Problem Relation Age of Onset  . Diabetes Mother   . Heart disease Father     Review of Systems: As noted in history of present illness. All other systems are  reviewed and are negative.   Physical Exam: BP 140/66 (BP Location: Left Arm, Patient Position: Sitting, Cuff Size: Normal)   Pulse 66   Ht 5\' 7"  (1.702 m)   Wt 211 lb 8 oz (95.9 kg)   BMI 33.13 kg/m  GENERAL:  Well appearing WM in NAD, obese HEENT:  PERRL, EOMI, sclera are clear. Oropharynx is clear. NECK:  No jugular venous distention, carotid upstroke brisk and symmetric, no bruits, no thyromegaly or adenopathy LUNGS:  Clear to auscultation bilaterally CHEST:  Unremarkable HEART:  RRR,  PMI not displaced or sustained,S1 and S2 within normal limits, no S3, no S4: no clicks, no rubs, no murmurs ABD:  Soft, nontender. BS +, no masses or bruits. No hepatomegaly, no splenomegaly EXT:  2 + pulses throughout, no edema, no cyanosis no clubbing SKIN:  Warm and  dry.  No rashes NEURO:  Alert and oriented x 3. Cranial nerves II through XII intact. PSYCH:  Cognitively intact    LABORATORY DATA: Lab Results  Component Value Date   WBC 7.1 12/22/2016   HGB 13.1 12/22/2016   HCT 39.4 12/22/2016   PLT 204 12/22/2016   GLUCOSE 126 (H) 02/09/2017   CHOL 138 01/08/2017   TRIG 88 01/08/2017   HDL 46 01/08/2017   LDLDIRECT 124.1 02/07/2012   LDLCALC 74 01/08/2017   ALT 25 01/08/2017   AST 21 01/08/2017   NA 140 02/09/2017   K 4.1 02/09/2017   CL 104 02/09/2017   CREATININE 0.82 02/09/2017   BUN 14 02/09/2017   CO2 26 02/09/2017   INR 0.96 08/02/2011   Labs dated 05/24/16: cholesterol 155, triglycerides 124, HDL 48, LDL 82.  Dated 05/30/17: cholesterol 160, triglycerides 144, HDL 48, LDL 84, Hgb 14.5. Creatinine 0.8, ALt normal  Study Result   Echo 01/29/17: Study Conclusions  - Left ventricle: The cavity size was normal. Wall thickness was   normal. Systolic function was moderately reduced. The estimated   ejection fraction was in the range of 35% to 40%. Moderate   diffuse hypokinesis with distinct regional wall motion   abnormalities. - Mitral valve: Calcified annulus. - Left atrium: The atrium was severely dilated. - Right atrium: The atrium was mildly dilated.  Myoview 01/17/17: Study Highlights     Nuclear stress EF: 45%.  Blood pressure demonstrated a hypertensive response to exercise.  There was no ST segment deviation noted during stress.  Findings consistent with prior myocardial infarction.  This is an intermediate risk study.  The left ventricular ejection fraction is mildly decreased (45-54%).   Small area of inferior wall infarct at mid and basal level no ischemia EF 45% apical hypokinesis Frequent ventricular ectopy during stress and recovery  HTN response to exercise      Assessment / Plan: 1. Chronic combined systolic and diastolic CHF. EF 35-40% by Echo. 45% by Myoview.  Will continue lasix 20 mg daily.  Potassium 20 meq daily. Continue Coreg and losartan.   Continue sodium restriction. Stressed importance of daily aerobic exercise and weight loss. Follow up in 6 months.  2. CAD s/p CABG 2007. On ASA, statin, beta blocker. Myoview shows inferior infarct without ischemia. He is asymptomatic.   3. Hyperlipidemia on statin. Lipids are OK.  4. HTN controlled.  5. AAA. 2.9 cm by [place] in 2016- will follow up with yearly CT for both thoracic and aortic aneurysm- in February 2019.   6. Mild thoracic aneurysm. Recommend CT yearly.

## 2017-06-25 ENCOUNTER — Ambulatory Visit (INDEPENDENT_AMBULATORY_CARE_PROVIDER_SITE_OTHER): Payer: PPO | Admitting: Cardiology

## 2017-06-25 ENCOUNTER — Encounter: Payer: Self-pay | Admitting: Cardiology

## 2017-06-25 VITALS — BP 140/66 | HR 66 | Ht 67.0 in | Wt 211.5 lb

## 2017-06-25 DIAGNOSIS — I714 Abdominal aortic aneurysm, without rupture, unspecified: Secondary | ICD-10-CM

## 2017-06-25 DIAGNOSIS — I5042 Chronic combined systolic (congestive) and diastolic (congestive) heart failure: Secondary | ICD-10-CM | POA: Diagnosis not present

## 2017-06-25 DIAGNOSIS — E785 Hyperlipidemia, unspecified: Secondary | ICD-10-CM | POA: Diagnosis not present

## 2017-06-25 DIAGNOSIS — I25709 Atherosclerosis of coronary artery bypass graft(s), unspecified, with unspecified angina pectoris: Secondary | ICD-10-CM | POA: Diagnosis not present

## 2017-06-25 DIAGNOSIS — I712 Thoracic aortic aneurysm, without rupture, unspecified: Secondary | ICD-10-CM

## 2017-06-25 NOTE — Patient Instructions (Signed)
Continue your current medication  Focus on a healthy diet, weight loss, and daily aerobic exercise  I will see you in 6 months

## 2017-12-06 ENCOUNTER — Telehealth: Payer: Self-pay | Admitting: Cardiology

## 2017-12-06 NOTE — Telephone Encounter (Signed)
Pt c/o medication issue:  1. Name of [place] Potassium   2. How are you currently taking this medication (dosage and times per day)?50 mg once a day   3. Are you having a reaction (difficulty breathing--STAT)? No  4. What is your medication issue? Recall

## 2017-12-06 NOTE — Telephone Encounter (Signed)
F/U call:  Patient was advised to call back to verify if his losartan medication was recalled.  Patient states that his losartan medication is NOT being recalled

## 2017-12-06 NOTE — Telephone Encounter (Signed)
Returned call to patient.Advised to call his pharmacy to see if his Losartan has been recalled.

## 2017-12-06 NOTE — Telephone Encounter (Signed)
Called pt back and thank him for letting us know his Losartan medication was not recalled

## 2017-12-22 NOTE — Progress Notes (Signed)
[name] Date of Birth: 1946-04-28   History of Present Illness: [name] is seen for  followup CAD. He has a history of coronary disease and is status post CABG with a Maze procedure in 2007 by Dr. Prescott [name]. Prior to CABG EF was 35-40%. CABG included an LIMA graft to LAD, saphenous vein graft to the obtuse marginal vessel, and saphenous vein graft sequentially to the PDA and posterior lateral branches of the right coronary. He quit smoking in 2007.   He was seen in the ED on 12/22/16 for symptoms of dyspnea on exertion, dry cough, chest pain and mid scapular pain. Ecg showed NSR with PVCs, prolonged QT. No acute ischemic changes. He had some edema. BNP elevated at 489. CXR showed mild vascular congestion and effusions. CTA showed no pulmonary embolus. There was enlargement of the thoracic aorta to 4 cm. He was sent home on lasix and potassium. Follow up Echo showed EF 35-40%. Myoview showed evidence of old inferior infarct. No ischemia. EF 45%. He was started on losartan 25 mg daily- this was increased on his last visit.  On follow up today he reports he is doing well. He denies any chest pain, dyspnea, palpitations, orthopnea, or PND. Still fairly sedentary. When weather is nice he will ride his bike.  Bowling 2 days a week. Getting a stationary bike.   Tolerating medication well. Restricting sodium intake. No increased swelling.   Current Outpatient Medications on File Prior to Visit  Medication Sig Dispense Refill  . Ascorbic Acid (VITAMIN C) 1000 MG tablet Take 1,000 mg by mouth daily.      Marland Kitchen atorvastatin (LIPITOR) 40 MG tablet Take 40 mg by mouth daily.    . carvedilol (COREG) 6.25 MG tablet Take 1 tablet (6.25 mg total) by mouth 2 (two) times daily. 60 tablet 11   No current facility-administered medications on file prior to visit.     No Known Allergies  Past Medical History:  Diagnosis Date  . AAA (abdominal aortic aneurysm) ([name])   . CAD (coronary artery disease) of artery  bypass graft   . Coronary artery disease   . Dyslipidemia   . HTN (hypertension)   . Hyperlipidemia   . Hypertension   . Low back pain   . PAC (premature atrial contraction)   . Paroxysmal atrial flutter [facility])     Past Surgical History:  Procedure Laterality Date  . CORONARY ARTERY BYPASS GRAFT  2007  . CORONARY ARTERY BYPASS GRAFT    . LUMBAR DISC ARTHROPLASTY    . MAZE  2007   MAZE PROCEDURE  . TONSILLECTOMY      Social History   Tobacco Use  Smoking Status Former Smoker  . Packs/day: 1.00  . Years: 30.00  . Pack years: 30.00  . Types: Cigarettes  . Last attempt to quit: 11/13/2005  . Years since quitting: 12.1  Smokeless Tobacco Never Used    Social History   Substance and Sexual Activity  Alcohol Use No    Family History  Problem Relation Age of Onset  . Diabetes Mother   . Heart disease Father     Review of Systems: As noted in history of present illness. All other systems are reviewed and are negative.   Physical Exam: BP 114/68   Pulse (!) 55   Ht 5\' 7"  (1.702 m)   Wt 213 lb 6.4 oz (96.8 kg)   BMI 33.42 kg/m  GENERAL:  Well appearing WM in NAD, obese HEENT:  PERRL, EOMI, sclera are clear. Oropharynx is clear. NECK:  No jugular venous distention, carotid upstroke brisk and symmetric, no bruits, no thyromegaly or adenopathy LUNGS:  Clear to auscultation bilaterally CHEST:  Unremarkable HEART:  IRRR,  PMI not displaced or sustained,S1 and S2 within normal limits, no S3, no S4: no clicks, no rubs, no murmurs ABD:  Soft, nontender. BS +, no masses or bruits. No hepatomegaly, no splenomegaly EXT:  2 + pulses throughout, no edema, no cyanosis no clubbing SKIN:  Warm and dry.  No rashes NEURO:  Alert and oriented x 3. Cranial nerves II through XII intact. PSYCH:  Cognitively intact    LABORATORY DATA: Lab Results  Component Value Date   WBC 7.1 12/22/2016   HGB 13.1 12/22/2016   HCT 39.4 12/22/2016   PLT 204 12/22/2016   GLUCOSE 126 (H)  02/09/2017   CHOL 138 01/08/2017   TRIG 88 01/08/2017   HDL 46 01/08/2017   LDLDIRECT 124.1 02/07/2012   LDLCALC 74 01/08/2017   ALT 25 01/08/2017   AST 21 01/08/2017   NA 140 02/09/2017   K 4.1 02/09/2017   CL 104 02/09/2017   CREATININE 0.82 02/09/2017   BUN 14 02/09/2017   CO2 26 02/09/2017   INR 0.96 08/02/2011   Labs dated 05/24/16: cholesterol 155, triglycerides 124, HDL 48, LDL 82.  Dated 05/30/17: cholesterol 160, triglycerides 144, HDL 48, LDL 84, Hgb 14.5. Creatinine 0.8, ALt normal  Ecg today shows Atrial fibrillation rate 55. Otherwise normal. I have personally reviewed and interpreted this study.   Study Result   Echo 01/29/17: Study Conclusions  - Left ventricle: The cavity size was normal. Wall thickness was   normal. Systolic function was moderately reduced. The estimated   ejection fraction was in the range of 35% to 40%. Moderate   diffuse hypokinesis with distinct regional wall motion   abnormalities. - Mitral valve: Calcified annulus. - Left atrium: The atrium was severely dilated. - Right atrium: The atrium was mildly dilated.  Myoview 01/17/17: Study Highlights     Nuclear stress EF: 45%.  Blood pressure demonstrated a hypertensive response to exercise.  There was no ST segment deviation noted during stress.  Findings consistent with prior myocardial infarction.  This is an intermediate risk study.  The left ventricular ejection fraction is mildly decreased (45-54%).   Small area of inferior wall infarct at mid and basal level no ischemia EF 45% apical hypokinesis Frequent ventricular ectopy during stress and recovery  HTN response to exercise      Assessment / Plan: 1. Chronic combined systolic and diastolic CHF. EF 35-40% by Echo. 45% by Myoview.  Will continue lasix 20 mg daily. Potassium 20 meq daily. Continue Coreg and losartan.   Continue sodium restriction. Stressed importance of daily aerobic exercise and weight loss.   2. CAD  s/p CABG 2007. On ASA, statin, beta blocker. Myoview shows inferior infarct without ischemia. He is asymptomatic.   3. Atrial fibrillation. New onset. Not present on stress test last March. He is completely asymptomatic. [place] Vasc score of 4. Recommend anticoagulation. Will check BMET today. Initiate Eliquis 5 mg bid and samples given. Some patient concern about cost. If too expensive will need to start Coumadin instead. Since he is asymptomatic I would favor rate control strategy only.   3. Hyperlipidemia on statin. Well controlled.   4. HTN controlled.  5. AAA. 2.9 cm by [place] in 2016- will follow up with yearly CT for both thoracic and aortic aneurysm- will  schedule.   6. Mild thoracic aneurysm. Recommend CT yearly.

## 2017-12-24 ENCOUNTER — Encounter: Payer: Self-pay | Admitting: Cardiology

## 2017-12-24 ENCOUNTER — Ambulatory Visit: Payer: Medicare HMO | Admitting: Cardiology

## 2017-12-24 VITALS — BP 114/68 | HR 55 | Ht 67.0 in | Wt 213.4 lb

## 2017-12-24 DIAGNOSIS — I4891 Unspecified atrial fibrillation: Secondary | ICD-10-CM | POA: Diagnosis not present

## 2017-12-24 DIAGNOSIS — I712 Thoracic aortic aneurysm, without rupture, unspecified: Secondary | ICD-10-CM

## 2017-12-24 DIAGNOSIS — I25709 Atherosclerosis of coronary artery bypass graft(s), unspecified, with unspecified angina pectoris: Secondary | ICD-10-CM | POA: Diagnosis not present

## 2017-12-24 DIAGNOSIS — I714 Abdominal aortic aneurysm, without rupture, unspecified: Secondary | ICD-10-CM

## 2017-12-24 DIAGNOSIS — E785 Hyperlipidemia, unspecified: Secondary | ICD-10-CM

## 2017-12-24 DIAGNOSIS — I5042 Chronic combined systolic (congestive) and diastolic (congestive) heart failure: Secondary | ICD-10-CM | POA: Diagnosis not present

## 2017-12-24 DIAGNOSIS — Z01818 Encounter for other preprocedural examination: Secondary | ICD-10-CM | POA: Diagnosis not present

## 2017-12-24 LAB — BASIC METABOLIC PANEL
BUN/Creatinine Ratio: 17 (ref 10–24)
BUN: 16 mg/dL (ref 8–27)
CALCIUM: 9.1 mg/dL (ref 8.6–10.2)
CO2: 23 mmol/L (ref 20–29)
Chloride: 101 mmol/L (ref 96–106)
Creatinine, Ser: 0.94 mg/dL (ref 0.76–1.27)
GFR, EST AFRICAN AMERICAN: 94 mL/min/{1.73_m2} (ref >59)
GFR, EST NON AFRICAN AMERICAN: 81 mL/min/{1.73_m2} (ref >59)
Glucose: 105 mg/dL — ABNORMAL HIGH (ref 65–99)
Potassium: 4.4 mmol/L (ref 3.5–5.2)
Sodium: 138 mmol/L (ref 134–144)

## 2017-12-24 MED ORDER — FUROSEMIDE 20 MG PO TABS: 20.0000 mg | ORAL_TABLET | Freq: Every day | ORAL | 3 refills | Status: DC

## 2017-12-24 MED ORDER — LOSARTAN POTASSIUM 50 MG PO TABS: 50.0000 mg | ORAL_TABLET | Freq: Every day | ORAL | 3 refills | Status: DC

## 2017-12-24 MED ORDER — POTASSIUM CHLORIDE CRYS ER 20 MEQ PO TBCR: 20.0000 meq | EXTENDED_RELEASE_TABLET | Freq: Every day | ORAL | 3 refills | Status: DC

## 2017-12-24 MED ORDER — APIXABAN 5 MG PO TABS: 5.0000 mg | ORAL_TABLET | Freq: Two times a day (BID) | ORAL | 2 refills | Status: DC

## 2017-12-24 NOTE — Patient Instructions (Addendum)
.  INSTStart Eliquis 5 mg daily for blood thinner  Stop taking ASA  We will check your kidney function today  We will arrange a CT angiogram- chest, abdomen, pelvis to follow up on your aneurysms.  I will see you in 3 months.    Marland Kitchen

## 2017-12-31 ENCOUNTER — Ambulatory Visit (HOSPITAL_COMMUNITY)
Admission: RE | Admit: 2017-12-31 | Discharge: 2017-12-31 | Disposition: A | Discharge status: Home / Self Care | Payer: Medicare HMO | Source: Ambulatory Visit | Attending: Cardiology | Admitting: Cardiology

## 2017-12-31 ENCOUNTER — Ambulatory Visit (HOSPITAL_COMMUNITY): Payer: Medicare HMO

## 2017-12-31 DIAGNOSIS — I714 Abdominal aortic aneurysm, without rupture, unspecified: Secondary | ICD-10-CM

## 2017-12-31 DIAGNOSIS — I712 Thoracic aortic aneurysm, without rupture, unspecified: Secondary | ICD-10-CM

## 2017-12-31 DIAGNOSIS — R918 Other nonspecific abnormal finding of lung field: Secondary | ICD-10-CM | POA: Diagnosis not present

## 2017-12-31 DIAGNOSIS — K5730 Diverticulosis of large intestine without perforation or abscess without bleeding: Secondary | ICD-10-CM | POA: Diagnosis not present

## 2017-12-31 DIAGNOSIS — I700 Atherosclerosis of aorta: Secondary | ICD-10-CM | POA: Insufficient documentation

## 2017-12-31 DIAGNOSIS — Z951 Presence of aortocoronary bypass graft: Secondary | ICD-10-CM | POA: Insufficient documentation

## 2017-12-31 MED ORDER — IOPAMIDOL (ISOVUE-370) INJECTION 76%
INTRAVENOUS | Status: AC
  Administered 2017-12-31: 100 mL
  Filled 2017-12-31: qty 100

## 2018-01-27 IMAGING — CT CT ANGIO CHEST
2 of 6 series · 18 of 36 positions shown · IV contrast (Omni 300)
Comparison: None.

CLINICAL DATA: Shortness of breast since 8788 hours, cough,
dropping saturations, elevated D-dimer, history coronary artery
disease post CABG, hypertension, former smoker, paroxysmal atrial
fibrillation

EXAM:
CT ANGIOGRAPHY CHEST WITH CONTRAST
TECHNIQUE: Multidetector CT imaging of the chest was performed using the
standard protocol during bolus administration of intravenous
contrast. Multiplanar CT image reconstructions and MIPs were
obtained to evaluate the vascular anatomy.
CONTRAST:  75 cc Isovue 370 IV

[Series 6: pe thins · axial · 0.81mm/px · z∈[+840,+1114]mm · 17 of 310 slices shown]
[im 18/310  lung]
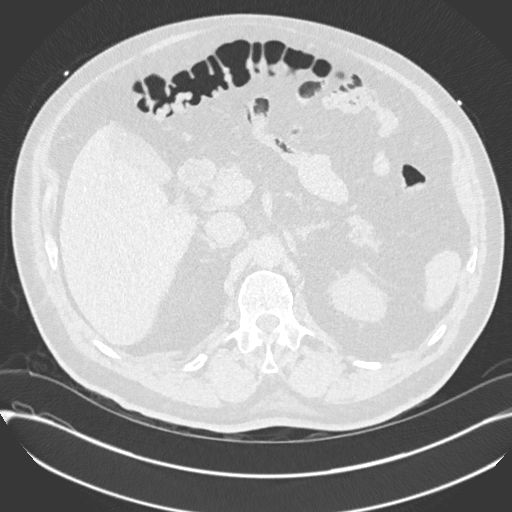
[im 35/310  mediastinal]
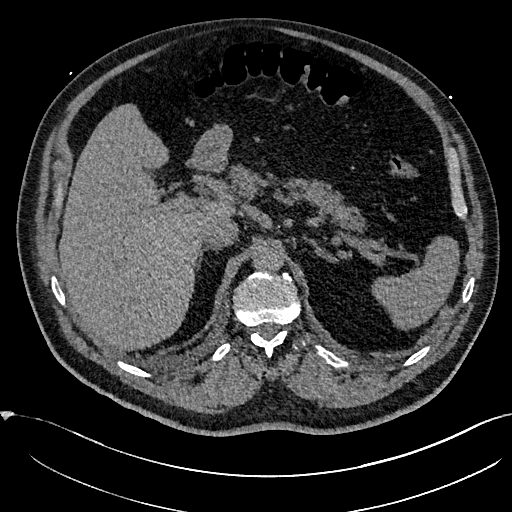
[im 52/310  lung]
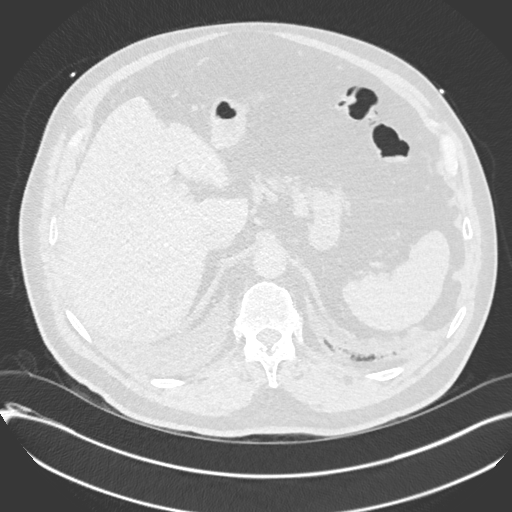
[im 69/310  mediastinal]
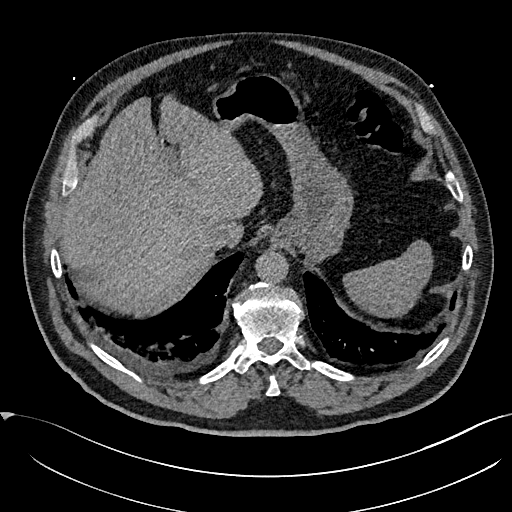
[im 86/310  lung]
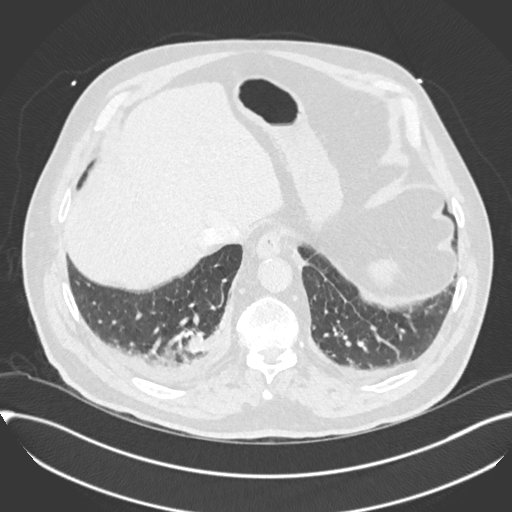
[im 104/310  mediastinal]
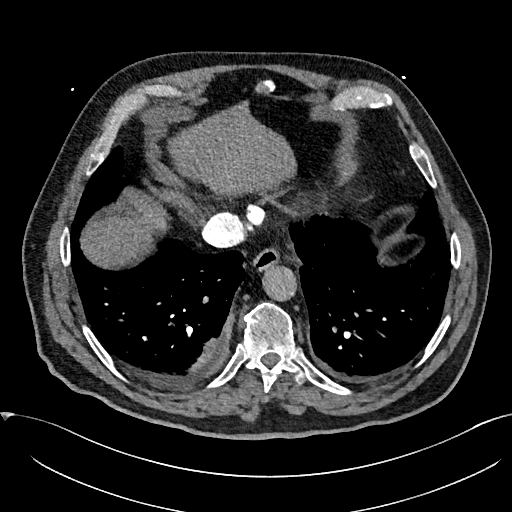
[im 121/310  lung]
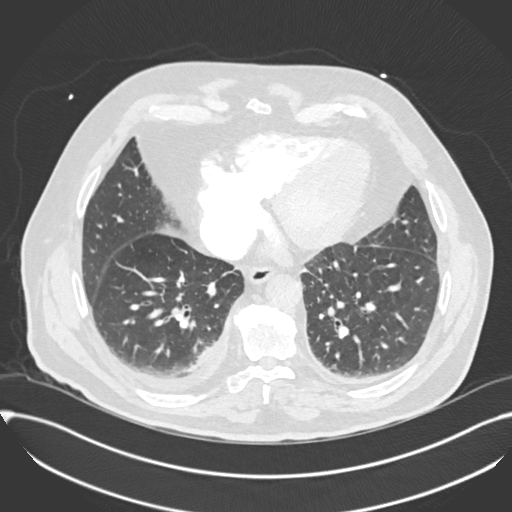
[im 138/310  mediastinal]
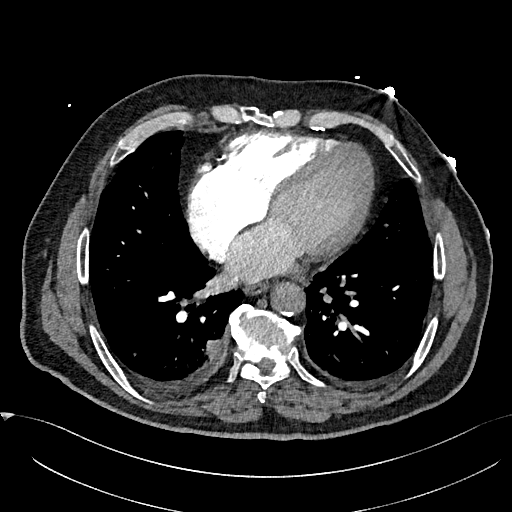
[im 155/310  lung]
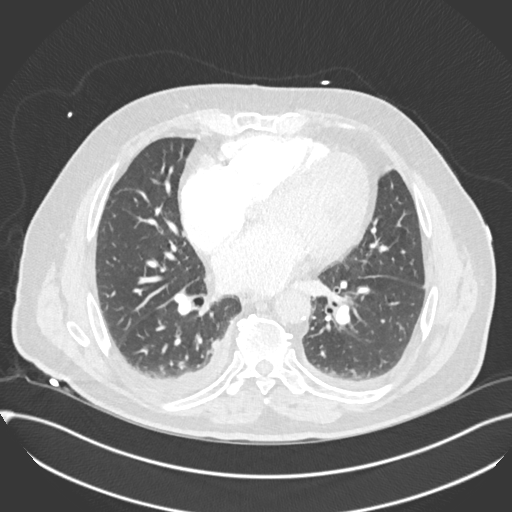
[im 172/310  mediastinal]
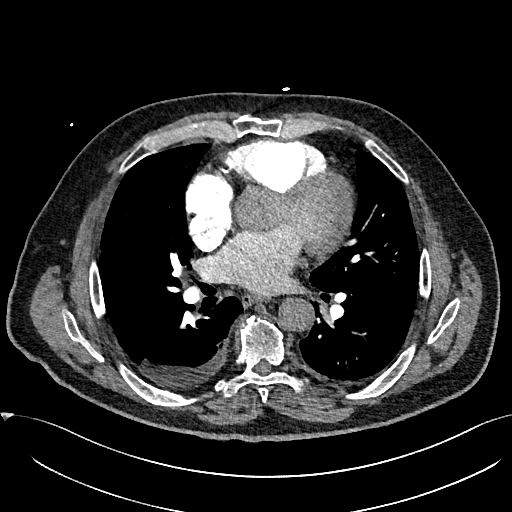
[im 189/310  lung]
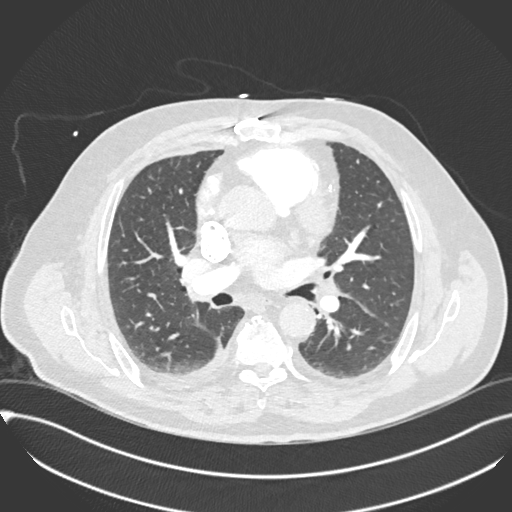
[im 207/310  mediastinal]
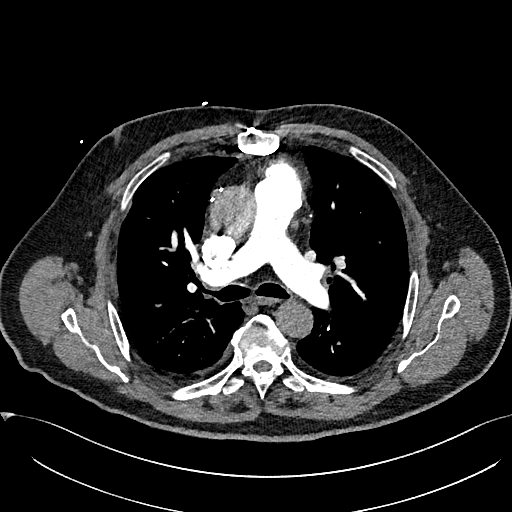
[im 224/310  lung]
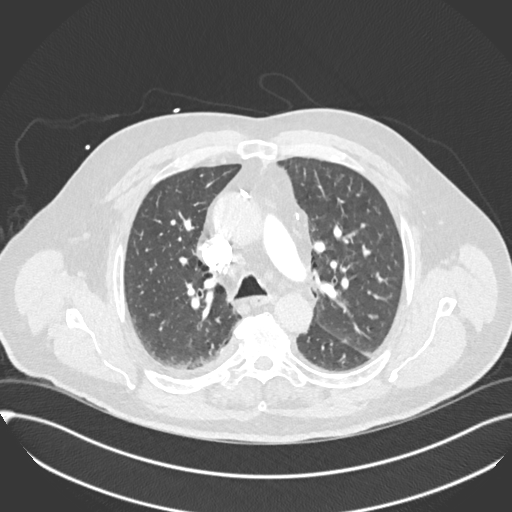
[im 241/310  mediastinal]
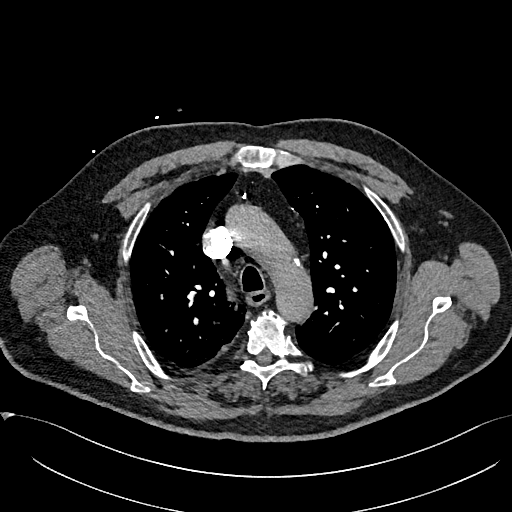
[im 258/310  lung]
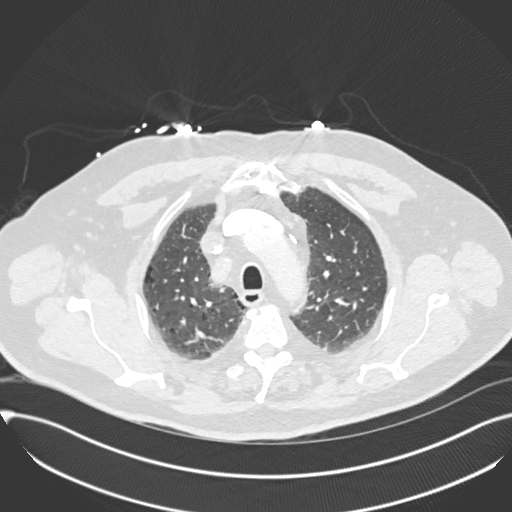
[im 275/310  mediastinal]
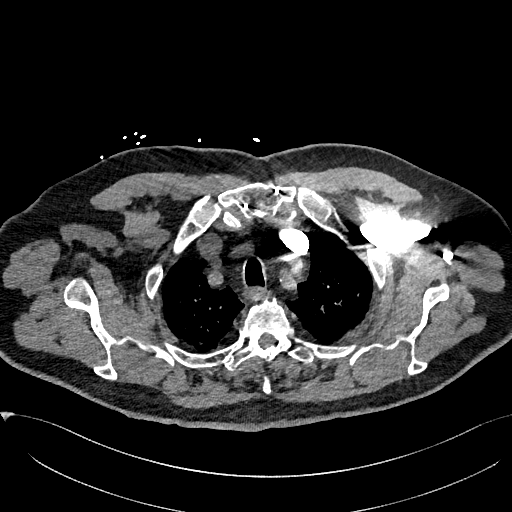
[im 292/310  lung]
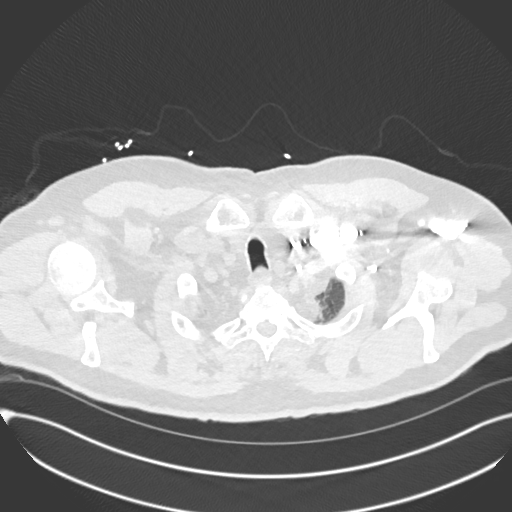

[Series 7: pe 2mm cor · coronal · 0.61mm/px · 1 of 151 slices shown]
[im 76/151  mediastinal]
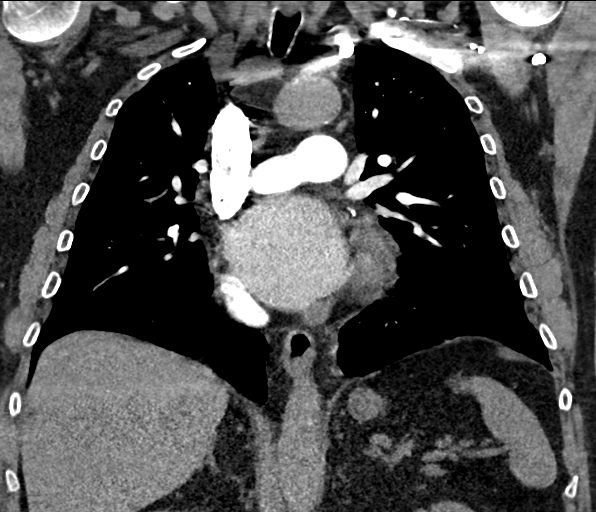

[18 of 36 positions shown; findings below may reference images not displayed]

FINDINGS: Cardiovascular: Atherosclerotic calcifications aorta, coronary
arteries, and proximal great vessels. Postsurgical changes of CABG.
Minimal aneurysmal dilatation of the ascending thoracic aorta 4.0 cm
transverse. Pulmonary arteries well opacified an patent. No evidence
of pulmonary embolism. Enlargement of cardiac chambers without
pericardial effusion.

Mediastinum/Nodes: Scattered normal size thoracic nodes without
adenopathy. Esophagus unremarkable. No hiatal hernia. Visualized
base of cervical region normal appearance.

Lungs/Pleura: Minimal pleural effusions bilaterally greater on
RIGHT. Central peribronchial thickening. Atelectasis dependently in
the posteromedial RIGHT lower lobe. Scattered subpleural blebs. No
acute infiltrate or pneumothorax. 5 mm subpleural nodule RIGHT upper
lobe image 48. Additional subpleural nodule 4 mm diameter RIGHT
upper lobe image 71.

Upper Abdomen: Unremarkable

Musculoskeletal: Demineralized with scattered degenerative disc
disease changes thoracic spine

Review of the MIP images confirms the above findings.
IMPRESSION: No evidence of pulmonary embolism.

Coronary arterial calcification post CABG.

Minimal pleural effusions and RIGHT basilar atelectasis.

Aortic atherosclerosis with aneurysmal dilatation of the ascending
thoracic aorta 4.0 cm transverse, recommendation below.

Recommend annual imaging followup by CTA or MRA. This recommendation
follows 3484 ACCF/AHA/AATS/ACR/ASA/SCA/GEOVANNY ANIBAL/PLAY/DAKUA/BAUDILIO Guidelines
for the Diagnosis and Management of Patients with Thoracic Aortic
Disease. Circulation. 3484; 121: [id number]

Two RIGHT lung pulmonary nodules largest 5 mm diameter,
recommendation below.

No follow-up needed if patient is low-risk (and has no known or
suspected primary neoplasm). Non-contrast chest CT can be considered
in 12 months if patient is high-risk. This recommendation follows
the consensus statement: Guidelines for Management of Incidental
Pulmonary Nodules Detected on CT Images: From the [HOSPITAL]

## 2018-03-04 ENCOUNTER — Telehealth: Payer: Self-pay | Admitting: Cardiology

## 2018-03-04 MED ORDER — LOSARTAN POTASSIUM 50 MG PO TABS: 50.0000 mg | ORAL_TABLET | Freq: Every day | ORAL | 3 refills | Status: DC

## 2018-03-04 NOTE — Telephone Encounter (Signed)
Pt c/o medication issue:  1. Name of Medication:  losartan (COZAAR) 50 MG tablet    2. How are you currently taking this medication (dosage and times per day)? Take 1 tablet (50 mg total) by mouth daily.  3. Are you having a reaction (difficulty breathing--STAT)? no  4. What is your medication issue? Pt want to know if he needs to DC this medication or get it refilled

## 2018-03-04 NOTE — Telephone Encounter (Signed)
Returned call to patient he stated he needed 90 day refill for losartan.Refill sent to pharmacy.Stated he needs teeth extracted.Advised dentist will need to send clearance to office,it will be addressed by preop pool.

## 2018-03-07 ENCOUNTER — Encounter: Payer: Self-pay | Admitting: Cardiology

## 2018-03-19 NOTE — Progress Notes (Signed)
Nivaan W Lease Date of Birth: 1945-12-02   History of Present Illness: [name] is seen for  followup CAD. He has a history of coronary disease and is status post CABG with a Maze procedure in 2007 by Dr. Prescott Gum. Prior to CABG EF was 35-40%. CABG included an LIMA graft to LAD, saphenous vein graft to the obtuse marginal vessel, and saphenous vein graft sequentially to the PDA and posterior lateral branches of the right coronary. He quit smoking in 2007.   He was seen in the ED on 12/22/16 for symptoms of dyspnea on exertion, dry cough, chest pain and mid scapular pain. Ecg showed NSR with PVCs, prolonged QT. No acute ischemic changes. He had some edema. BNP elevated at 489. CXR showed mild vascular congestion and effusions. CTA showed no pulmonary embolus. There was enlargement of the thoracic aorta to 4 cm. He was sent home on lasix and potassium. Follow up Echo showed EF 35-40%. Myoview showed evidence of old inferior infarct. No ischemia. EF 45%. He was started on losartan 25 mg daily- this was increased on his last visit.  On follow up today he reports he is doing well. He denies any chest pain, dyspnea, palpitations, orthopnea, or PND. Still fairly sedentary. He has just gotten a stationary bike. Enjoys bowling in the winter.   He is scheduled to have extensive dental work with multiple extractions.   Current Outpatient Medications on File Prior to Visit  Medication Sig Dispense Refill  . apixaban (ELIQUIS) 5 MG TABS tablet Take 1 tablet (5 mg total) by mouth 2 (two) times daily. 180 tablet 2  . Ascorbic Acid (VITAMIN C) 1000 MG tablet Take 1,000 mg by mouth daily.      Marland Kitchen atorvastatin (LIPITOR) 40 MG tablet Take 40 mg by mouth daily.    . carvedilol (COREG) 6.25 MG tablet Take 1 tablet (6.25 mg total) by mouth 2 (two) times daily. 60 tablet 11  . furosemide (LASIX) 20 MG tablet Take 1 tablet (20 mg total) by mouth daily. 90 tablet 3  . losartan (COZAAR) 50 MG tablet Take 1 tablet (50 mg  total) by mouth daily. 90 tablet 3  . potassium chloride SA (K-DUR,KLOR-CON) 20 MEQ tablet Take 1 tablet (20 mEq total) by mouth daily. 90 tablet 3   No current facility-administered medications on file prior to visit.     No Known Allergies  Past Medical History:  Diagnosis Date  . AAA (abdominal aortic aneurysm) ([place])   . CAD (coronary artery disease) of artery bypass graft   . Coronary artery disease   . Dyslipidemia   . HTN (hypertension)   . Hyperlipidemia   . Hypertension   . Low back pain   . PAC (premature atrial contraction)   . Paroxysmal atrial flutter [facility])     Past Surgical History:  Procedure Laterality Date  . CORONARY ARTERY BYPASS GRAFT  2007  . CORONARY ARTERY BYPASS GRAFT    . LUMBAR DISC ARTHROPLASTY    . MAZE  2007   MAZE PROCEDURE  . TONSILLECTOMY      Social History   Tobacco Use  Smoking Status Former Smoker  . Packs/day: 1.00  . Years: 30.00  . Pack years: 30.00  . Types: Cigarettes  . Last attempt to quit: 11/13/2005  . Years since quitting: 12.3  Smokeless Tobacco Never Used    Social History   Substance and Sexual Activity  Alcohol Use No    Family History  Problem Relation Age  of Onset  . Diabetes Mother   . Heart disease Father     Review of Systems: As noted in history of present illness. All other systems are reviewed and are negative.   Physical Exam: BP 126/76   Pulse 74   Ht 5' 6.5" (1.689 m)   Wt 213 lb 3.2 oz (96.7 kg)   SpO2 94%   BMI 33.90 kg/m  GENERAL:  Well appearing, obese WM in NAD HEENT:  PERRL, EOMI, sclera are clear. Oropharynx is clear. NECK:  No jugular venous distention, carotid upstroke brisk and symmetric, no bruits, no thyromegaly or adenopathy LUNGS:  Clear to auscultation bilaterally CHEST:  Unremarkable HEART:  RRR,  PMI not displaced or sustained,S1 and S2 within normal limits, no S3, no S4: no clicks, no rubs, no murmurs ABD:  Soft, nontender. BS +, no masses or bruits. No hepatomegaly,  no splenomegaly EXT:  2 + pulses throughout, no edema, no cyanosis no clubbing SKIN:  Warm and dry.  No rashes NEURO:  Alert and oriented x 3. Cranial nerves II through XII intact. PSYCH:  Cognitively intact   LABORATORY DATA: Lab Results  Component Value Date   WBC 7.1 12/22/2016   HGB 13.1 12/22/2016   HCT 39.4 12/22/2016   PLT 204 12/22/2016   GLUCOSE 105 (H) 12/24/2017   CHOL 138 01/08/2017   TRIG 88 01/08/2017   HDL 46 01/08/2017   LDLDIRECT 124.1 02/07/2012   LDLCALC 74 01/08/2017   ALT 25 01/08/2017   AST 21 01/08/2017   NA 138 12/24/2017   K 4.4 12/24/2017   CL 101 12/24/2017   CREATININE 0.94 12/24/2017   BUN 16 12/24/2017   CO2 23 12/24/2017   INR 0.96 08/02/2011   Labs dated 05/24/16: cholesterol 155, triglycerides 124, HDL 48, LDL 82.  Dated 05/30/17: cholesterol 160, triglycerides 144, HDL 48, LDL 84, Hgb 14.5. Creatinine 0.8, ALt normal    Study Result   Echo 01/29/17: Study Conclusions  - Left ventricle: The cavity size was normal. Wall thickness was   normal. Systolic function was moderately reduced. The estimated   ejection fraction was in the range of 35% to 40%. Moderate   diffuse hypokinesis with distinct regional wall motion   abnormalities. - Mitral valve: Calcified annulus. - Left atrium: The atrium was severely dilated. - Right atrium: The atrium was mildly dilated.  Myoview 01/17/17: Study Highlights     Nuclear stress EF: 45%.  Blood pressure demonstrated a hypertensive response to exercise.  There was no ST segment deviation noted during stress.  Findings consistent with prior myocardial infarction.  This is an intermediate risk study.  The left ventricular ejection fraction is mildly decreased (45-54%).   Small area of inferior wall infarct at mid and basal level no ischemia EF 45% apical hypokinesis Frequent ventricular ectopy during stress and recovery  HTN response to exercise      Assessment / Plan: 1. Chronic  combined systolic and diastolic CHF. EF 35-40% by Echo. 45% by Myoview.Continue Coreg and losartan.   Continue sodium restriction. Stressed importance of daily aerobic exercise and weight loss.   2. CAD s/p CABG 2007. On ASA, statin, beta blocker. Myoview in March 2018 showed inferior infarct without ischemia. He is asymptomatic.   3. Atrial fibrillation. Persistent.  Not present on stress test last March 2018. He is completely asymptomatic. [place] Vasc score of 4. Recommend continued anticoagulation with Eliquis. Continue rate control. With extensive dental work planned I would hold Eliquis 2 days prior.  3. Hyperlipidemia on statin. Well controlled.   4. HTN controlled.  5. AAA. 2.9 cm by [place] in 2016- will follow up with yearly CT for both thoracic and aortic aneurysm- will schedule.   6. Mild thoracic aneurysm. Recommend CT yearly. Last study in February 2019.

## 2018-03-21 ENCOUNTER — Encounter: Payer: Self-pay | Admitting: Cardiology

## 2018-03-21 ENCOUNTER — Ambulatory Visit: Payer: Medicare HMO | Admitting: Cardiology

## 2018-03-21 VITALS — BP 126/76 | HR 74 | Ht 66.5 in | Wt 213.2 lb

## 2018-03-21 DIAGNOSIS — I712 Thoracic aortic aneurysm, without rupture, unspecified: Secondary | ICD-10-CM

## 2018-03-21 DIAGNOSIS — I25709 Atherosclerosis of coronary artery bypass graft(s), unspecified, with unspecified angina pectoris: Secondary | ICD-10-CM | POA: Diagnosis not present

## 2018-03-21 DIAGNOSIS — I4891 Unspecified atrial fibrillation: Secondary | ICD-10-CM | POA: Diagnosis not present

## 2018-03-21 DIAGNOSIS — I714 Abdominal aortic aneurysm, without rupture, unspecified: Secondary | ICD-10-CM

## 2018-03-21 NOTE — Patient Instructions (Signed)
Continue your current therapy  You may hold Eliquis for 2 days for planned dental work.  I will see you in 6 months.

## 2018-06-25 DIAGNOSIS — Z0000 Encounter for general adult medical examination without abnormal findings: Secondary | ICD-10-CM | POA: Diagnosis not present

## 2018-06-25 DIAGNOSIS — I2510 Atherosclerotic heart disease of native coronary artery without angina pectoris: Secondary | ICD-10-CM | POA: Diagnosis not present

## 2018-06-25 DIAGNOSIS — I77811 Abdominal aortic ectasia: Secondary | ICD-10-CM | POA: Diagnosis not present

## 2018-06-25 DIAGNOSIS — I119 Hypertensive heart disease without heart failure: Secondary | ICD-10-CM | POA: Diagnosis not present

## 2018-06-25 DIAGNOSIS — Z6835 Body mass index (BMI) 35.0-35.9, adult: Secondary | ICD-10-CM | POA: Diagnosis not present

## 2018-06-25 DIAGNOSIS — I481 Persistent atrial fibrillation: Secondary | ICD-10-CM | POA: Diagnosis not present

## 2018-06-25 DIAGNOSIS — E782 Mixed hyperlipidemia: Secondary | ICD-10-CM | POA: Diagnosis not present

## 2018-06-25 DIAGNOSIS — Z1389 Encounter for screening for other disorder: Secondary | ICD-10-CM | POA: Diagnosis not present

## 2018-06-25 DIAGNOSIS — Z125 Encounter for screening for malignant neoplasm of prostate: Secondary | ICD-10-CM | POA: Diagnosis not present

## 2018-06-25 DIAGNOSIS — I5042 Chronic combined systolic (congestive) and diastolic (congestive) heart failure: Secondary | ICD-10-CM | POA: Diagnosis not present

## 2018-08-09 DIAGNOSIS — R69 Illness, unspecified: Secondary | ICD-10-CM | POA: Diagnosis not present

## 2018-09-16 NOTE — Progress Notes (Signed)
[name] Date of Birth: Jun 05, 1946   History of Present Illness: [name] is seen for  followup CAD. He has a history of coronary disease and is status post CABG with a Maze procedure in 2007 by Dr. Prescott Gum. Prior to CABG EF was 35-40%. CABG included an LIMA graft to LAD, saphenous vein graft to the obtuse marginal vessel, and saphenous vein graft sequentially to the PDA and posterior lateral branches of the right coronary. He quit smoking in 2007.   He was seen in the ED on 12/22/16 for symptoms of dyspnea on exertion, dry cough, chest pain and mid scapular pain. Ecg showed NSR with PVCs, prolonged QT. No acute ischemic changes. He had some edema. BNP elevated at 489. CXR showed mild vascular congestion and effusions. CTA showed no pulmonary embolus. There was enlargement of the thoracic aorta to 4 cm. He was sent home on lasix and potassium. Follow up Echo showed EF 35-40%. Myoview showed evidence of old inferior infarct. No ischemia. EF 45%. He was started on losartan 25 mg daily- this was increased on his last visit.  On follow up today he reports he is doing well. He did have extensive dental work with extractions done. Now has dentures. Since June he has had loose stools and diarrhea. He thinks this is related to Eliquis but his wife thinks its related to the dental adhesive he uses. He denies any chest pain, dyspnea, palpitations, orthopnea, or PND. Still fairly sedentary.   Current Outpatient Medications on File Prior to Visit  Medication Sig Dispense Refill  . Ascorbic Acid (VITAMIN C) 1000 MG tablet Take 1,000 mg by mouth daily.      Marland Kitchen atorvastatin (LIPITOR) 40 MG tablet Take 40 mg by mouth daily.    . carvedilol (COREG) 6.25 MG tablet Take 1 tablet (6.25 mg total) by mouth 2 (two) times daily. 60 tablet 11  . Coenzyme Q10 200 MG capsule Take 200 mg by mouth every other day.    . cyanocobalamin 1000 MCG tablet Take 1,000 mcg by mouth daily.    . furosemide (LASIX) 20 MG tablet  Take 1 tablet (20 mg total) by mouth daily. 90 tablet 3  . losartan (COZAAR) 50 MG tablet Take 1 tablet (50 mg total) by mouth daily. 90 tablet 3  . Milk Thistle 250 MG CAPS Take 1 capsule by mouth every other day.    . potassium chloride SA (K-DUR,KLOR-CON) 20 MEQ tablet Take 1 tablet (20 mEq total) by mouth daily. 90 tablet 3   No current facility-administered medications on file prior to visit.     No Known Allergies  Past Medical History:  Diagnosis Date  . AAA (abdominal aortic aneurysm) ([place])   . CAD (coronary artery disease) of artery bypass graft   . Coronary artery disease   . Dyslipidemia   . HTN (hypertension)   . Hyperlipidemia   . Hypertension   . Low back pain   . PAC (premature atrial contraction)   . Paroxysmal atrial flutter [facility])     Past Surgical History:  Procedure Laterality Date  . CORONARY ARTERY BYPASS GRAFT  2007  . CORONARY ARTERY BYPASS GRAFT    . LUMBAR DISC ARTHROPLASTY    . MAZE  2007   MAZE PROCEDURE  . TONSILLECTOMY      Social History   Tobacco Use  Smoking Status Former Smoker  . Packs/day: 1.00  . Years: 30.00  . Pack years: 30.00  . Types: Cigarettes  .  Last attempt to quit: 11/13/2005  . Years since quitting: 12.8  Smokeless Tobacco Never Used    Social History   Substance and Sexual Activity  Alcohol Use No    Family History  Problem Relation Age of Onset  . Diabetes Mother   . Heart disease Father     Review of Systems: As noted in history of present illness. All other systems are reviewed and are negative.   Physical Exam: BP 120/72   Pulse 75   Ht 5' 6.5" (1.689 m)   Wt 196 lb 12.8 oz (89.3 kg)   BMI 31.29 kg/m  GENERAL:  Well appearing WM in NAD HEENT:  PERRL, EOMI, sclera are clear. Oropharynx is clear. NECK:  No jugular venous distention, carotid upstroke brisk and symmetric, no bruits, no thyromegaly or adenopathy LUNGS:  Clear to auscultation bilaterally CHEST:  Unremarkable HEART:  IRRR,  PMI not  displaced or sustained,S1 and S2 within normal limits, no S3, no S4: no clicks, no rubs, no murmurs ABD:  Soft, nontender. BS +, no masses or bruits. No hepatomegaly, no splenomegaly EXT:  2 + pulses throughout, no edema, no cyanosis no clubbing SKIN:  Warm and dry.  No rashes NEURO:  Alert and oriented x 3. Cranial nerves II through XII intact. PSYCH:  Cognitively intact     LABORATORY DATA: Lab Results  Component Value Date   WBC 7.1 12/22/2016   HGB 13.1 12/22/2016   HCT 39.4 12/22/2016   PLT 204 12/22/2016   GLUCOSE 105 (H) 12/24/2017   CHOL 138 01/08/2017   TRIG 88 01/08/2017   HDL 46 01/08/2017   LDLDIRECT 124.1 02/07/2012   LDLCALC 74 01/08/2017   ALT 25 01/08/2017   AST 21 01/08/2017   NA 138 12/24/2017   K 4.4 12/24/2017   CL 101 12/24/2017   CREATININE 0.94 12/24/2017   BUN 16 12/24/2017   CO2 23 12/24/2017   INR 0.96 08/02/2011   Labs dated 05/24/16: cholesterol 155, triglycerides 124, HDL 48, LDL 82.  Dated 05/30/17: cholesterol 160, triglycerides 144, HDL 48, LDL 84, Hgb 14.5. Creatinine 0.8, ALt normal Dated 06/25/18: cholesterol 141, triglycerides 92, HDL 52, LDL 70. Normal chemistry and CBC.    Study Result   Echo 01/29/17: Study Conclusions  - Left ventricle: The cavity size was normal. Wall thickness was   normal. Systolic function was moderately reduced. The estimated   ejection fraction was in the range of 35% to 40%. Moderate   diffuse hypokinesis with distinct regional wall motion   abnormalities. - Mitral valve: Calcified annulus. - Left atrium: The atrium was severely dilated. - Right atrium: The atrium was mildly dilated.  Myoview 01/17/17: Study Highlights     Nuclear stress EF: 45%.  Blood pressure demonstrated a hypertensive response to exercise.  There was no ST segment deviation noted during stress.  Findings consistent with prior myocardial infarction.  This is an intermediate risk study.  The left ventricular ejection  fraction is mildly decreased (45-54%).   Small area of inferior wall infarct at mid and basal level no ischemia EF 45% apical hypokinesis Frequent ventricular ectopy during stress and recovery  HTN response to exercise    CLINICAL DATA:  Thoracic and abdominal aortic aneurysm without rupture.  EXAM: CT ANGIOGRAPHY CHEST, ABDOMEN AND PELVIS  TECHNIQUE: Multidetector CT imaging through the chest, abdomen and pelvis was performed using the standard protocol during bolus administration of intravenous contrast. Multiplanar reconstructed images and MIPs were obtained and reviewed to evaluate the  vascular anatomy.  CONTRAST:  179mL ISOVUE-370 IOPAMIDOL (ISOVUE-370) INJECTION 76%  COMPARISON:  CT scan of December 22, 2016.  FINDINGS: CTA CHEST FINDINGS  Cardiovascular: Aortic root is mildly dilated at 4.0 cm. Status post coronary artery bypass graft. Stable 4 cm ascending thoracic aortic aneurysm is noted without dissection. Atherosclerosis is noted. Great vessels are widely patent without significant stenosis. Transverse aortic arch measures 2.9 cm in diameter. Proximal descending thoracic aorta measures 2.7 cm. No pericardial effusion is noted.  Mediastinum/Nodes: No enlarged mediastinal, hilar, or axillary lymph nodes. Thyroid gland, trachea, and esophagus demonstrate no significant findings.  Lungs/Pleura: No pneumothorax or pleural effusion is noted. Multiple subpleural blebs are noted in both upper lobes. Mild right posterior basilar scarring is noted. Stable 5 mm subpleural nodule is noted in right upper lobe. No acute pulmonary disease is noted.  Musculoskeletal: No chest wall abnormality. No acute or significant osseous findings.  Review of the MIP images confirms the above findings.  CTA ABDOMEN AND PELVIS FINDINGS  VASCULAR  Aorta: Atherosclerosis of abdominal aorta is noted without aneurysm or dissection. Maximum measured AP diameter of 2.9 cm was  measured.  Celiac: Moderate stenosis is seen at the origin of the celiac artery secondary to calcified plaque.  SMA: Patent without evidence of aneurysm, dissection, vasculitis or significant stenosis.  Renals: Both renal arteries are patent without evidence of aneurysm, dissection, vasculitis, fibromuscular dysplasia or significant stenosis.  IMA: Patent without evidence of aneurysm, dissection, vasculitis or significant stenosis.  Inflow: Patent without evidence of aneurysm, dissection, vasculitis or significant stenosis.  Veins: No obvious venous abnormality within the limitations of this arterial phase study.  Review of the MIP images confirms the above findings.  NON-VASCULAR  Hepatobiliary: No focal liver abnormality is seen. No gallstones, gallbladder wall thickening, or biliary dilatation.  Pancreas: Unremarkable. No pancreatic ductal dilatation or surrounding inflammatory changes.  Spleen: Normal in size without focal abnormality.  Adrenals/Urinary Tract: Adrenal glands are unremarkable. Kidneys are normal, without renal calculi, focal lesion, or hydronephrosis. Bladder is unremarkable.  Stomach/Bowel: Stomach is within normal limits. Appendix appears normal. No evidence of bowel wall thickening, distention, or inflammatory changes. Sigmoid diverticulosis is noted without inflammation.  Lymphatic: No significant adenopathy is noted.  Reproductive: Prostate is unremarkable.  Other: No abdominal wall hernia or abnormality. No abdominopelvic ascites.  Musculoskeletal: No acute or significant osseous findings.  Review of the MIP images confirms the above findings.  IMPRESSION: 4.0 cm ascending thoracic aortic aneurysm. Aortic root is mildly dilated at 4.0 cm. Recommend annual imaging followup by CTA or MRA. This recommendation follows 2010 ACCF/AHA/AATS/ACR/ASA/SCA/SCAI/SIR/STS/SVM Guidelines for the Diagnosis and Management of  Patients with Thoracic Aortic Disease. Circulation. 2010; 121: O350-K938.  Status post coronary artery bypass graft.  Multiple subpleural blebs are noted in both upper lobes. Stable right subpleural nodule is noted which can be considered benign at this point with no further follow-up required.  Atherosclerosis of abdominal aorta is noted without aneurysm or dissection.  Sigmoid diverticulosis without inflammation.  Aortic Atherosclerosis (ICD10-I70.0).   Electronically Signed   By: Marijo Conception, M.D.   On: 01/01/2018 10:05   Assessment / Plan: 1. Chronic combined systolic and diastolic CHF. EF 35-40% by Echo. 45% by Myoview.Continue Coreg and losartan.   Continue sodium restriction. He is euvolemic.  Stressed importance of daily aerobic exercise.  2. CAD s/p CABG 2007. On ASA, statin, beta blocker. Myoview in March 2018 showed inferior infarct without ischemia. He is asymptomatic.   3. Atrial fibrillation. Persistent.  Not present on stress test last March 2018. He is asymptomatic. [place] Vasc score of 4. Recommend continued anticoagulation. Given his concerns about Eliquis will switch to Xarelto 20 mg daily.  Continue rate control.   3. Hyperlipidemia on statin. Well controlled.   4. HTN controlled.  5. AAA. 2.9 cm by [place] in 2016 and by CT in February 2019.- will follow up with yearly CT for both thoracic and aortic aneurysm. Repeat CT in February.   6. Mild thoracic aneurysm 4.0 cm. Recommend CT yearly. Last study in February 2019.

## 2018-09-19 ENCOUNTER — Encounter: Payer: Self-pay | Admitting: Cardiology

## 2018-09-19 ENCOUNTER — Ambulatory Visit: Payer: Medicare HMO | Admitting: Cardiology

## 2018-09-19 VITALS — BP 120/72 | HR 75 | Ht 66.5 in | Wt 196.8 lb

## 2018-09-19 DIAGNOSIS — I5042 Chronic combined systolic (congestive) and diastolic (congestive) heart failure: Secondary | ICD-10-CM | POA: Diagnosis not present

## 2018-09-19 DIAGNOSIS — I4819 Other persistent atrial fibrillation: Secondary | ICD-10-CM | POA: Diagnosis not present

## 2018-09-19 DIAGNOSIS — I712 Thoracic aortic aneurysm, without rupture, unspecified: Secondary | ICD-10-CM

## 2018-09-19 DIAGNOSIS — I25709 Atherosclerosis of coronary artery bypass graft(s), unspecified, with unspecified angina pectoris: Secondary | ICD-10-CM | POA: Diagnosis not present

## 2018-09-19 MED ORDER — RIVAROXABAN 20 MG PO TABS: 20.0000 mg | ORAL_TABLET | Freq: Every day | ORAL | 11 refills | Status: DC

## 2018-09-19 NOTE — Patient Instructions (Addendum)
Stop Eliquis  Start Xarelto 20 mg daily instead  Continue your other therapy   Schedule Chest CT in 12/2018 to follow up thoracic and abd aneurysms

## 2018-09-19 NOTE — Addendum Note (Signed)
Addended by: Kathyrn Lass on: 09/19/2018 09:46 AM   Modules accepted: Orders

## 2018-09-30 ENCOUNTER — Telehealth: Payer: Self-pay | Admitting: Cardiology

## 2018-09-30 NOTE — Telephone Encounter (Signed)
Lmom. Pt is contact his pharmacy if his prescription is being auto refilled and he is concerned about it being refilled to early. Pt can call back if he has any questions or concerns.

## 2018-09-30 NOTE — Telephone Encounter (Signed)
New Message    Pt c/o medication issue:  1. Name of Medication: rivaroxaban (XARELTO) 20 MG TABS tablet  2. How are you currently taking this medication (dosage and times per day)? Take 1 tablet (20 mg total) by mouth daily with supper.  3. Are you having a reaction (difficulty breathing--STAT)? no  4. What is your medication issue? Pt states that the prescription is not suppose to be until Jan but pt received a call from his pharm saying his Xarelto is ready Qty of 130 and he is concerned about ins and the price. Please call

## 2018-12-16 ENCOUNTER — Other Ambulatory Visit: Payer: Self-pay

## 2018-12-16 DIAGNOSIS — I714 Abdominal aortic aneurysm, without rupture, unspecified: Secondary | ICD-10-CM

## 2018-12-25 DIAGNOSIS — I5042 Chronic combined systolic (congestive) and diastolic (congestive) heart failure: Secondary | ICD-10-CM | POA: Diagnosis not present

## 2018-12-25 DIAGNOSIS — I4819 Other persistent atrial fibrillation: Secondary | ICD-10-CM | POA: Diagnosis not present

## 2018-12-25 DIAGNOSIS — I712 Thoracic aortic aneurysm, without rupture: Secondary | ICD-10-CM | POA: Diagnosis not present

## 2018-12-25 DIAGNOSIS — I25709 Atherosclerosis of coronary artery bypass graft(s), unspecified, with unspecified angina pectoris: Secondary | ICD-10-CM | POA: Diagnosis not present

## 2018-12-26 LAB — BASIC METABOLIC PANEL
BUN / CREAT RATIO: 17 (ref 10–24)
BUN: 15 mg/dL (ref 8–27)
CO2: 21 mmol/L (ref 20–29)
CREATININE: 0.89 mg/dL (ref 0.76–1.27)
Calcium: 9.1 mg/dL (ref 8.6–10.2)
Chloride: 101 mmol/L (ref 96–106)
GFR, EST AFRICAN AMERICAN: 99 mL/min/{1.73_m2} (ref >59)
GFR, EST NON AFRICAN AMERICAN: 85 mL/min/{1.73_m2} (ref >59)
Glucose: 112 mg/dL — ABNORMAL HIGH (ref 65–99)
Potassium: 4.3 mmol/L (ref 3.5–5.2)
SODIUM: 138 mmol/L (ref 134–144)

## 2018-12-30 ENCOUNTER — Ambulatory Visit (HOSPITAL_COMMUNITY)
Admission: RE | Admit: 2018-12-30 | Discharge: 2018-12-30 | Disposition: A | Discharge status: Home / Self Care | Payer: Medicare HMO | Source: Ambulatory Visit | Attending: Cardiology | Admitting: Cardiology

## 2018-12-30 DIAGNOSIS — I714 Abdominal aortic aneurysm, without rupture, unspecified: Secondary | ICD-10-CM

## 2018-12-31 ENCOUNTER — Telehealth: Payer: Self-pay | Admitting: Cardiology

## 2018-12-31 NOTE — Telephone Encounter (Signed)
Spoke with patient and advised to arrive at 9:15 for CT tomorrow, no solid food 2 hours prior, and liquids ok. Confirmed location

## 2018-12-31 NOTE — Telephone Encounter (Signed)
New Message ° ° ° ° ° ° ° ° ° °Patient returned your call, would like a call back. °

## 2019-01-01 ENCOUNTER — Ambulatory Visit (INDEPENDENT_AMBULATORY_CARE_PROVIDER_SITE_OTHER)
Admission: RE | Admit: 2019-01-01 | Discharge: 2019-01-01 | Disposition: A | Discharge status: Home / Self Care | Payer: Medicare HMO | Source: Ambulatory Visit | Attending: Cardiology | Admitting: Cardiology

## 2019-01-01 DIAGNOSIS — I712 Thoracic aortic aneurysm, without rupture, unspecified: Secondary | ICD-10-CM

## 2019-01-01 MED ORDER — IOPAMIDOL (ISOVUE-370) INJECTION 76%
75.0000 mL | Freq: Once | INTRAVENOUS | Status: AC | PRN
  Administered 2019-01-01: 75 mL via INTRAVENOUS

## 2019-01-27 ENCOUNTER — Other Ambulatory Visit: Payer: Self-pay

## 2019-01-28 MED ORDER — FUROSEMIDE 20 MG PO TABS: 20.0000 mg | ORAL_TABLET | Freq: Every day | ORAL | 1 refills | Status: DC

## 2019-01-28 MED ORDER — POTASSIUM CHLORIDE CRYS ER 20 MEQ PO TBCR: 20.0000 meq | EXTENDED_RELEASE_TABLET | Freq: Every day | ORAL | 1 refills | Status: DC

## 2019-03-04 ENCOUNTER — Other Ambulatory Visit: Payer: Self-pay | Admitting: Cardiology

## 2019-03-04 MED ORDER — LOSARTAN POTASSIUM 50 MG PO TABS: 50.0000 mg | ORAL_TABLET | Freq: Every day | ORAL | 0 refills | Status: DC

## 2019-03-04 NOTE — Telephone Encounter (Signed)
New Message    *STAT* If patient is at the pharmacy, call can be transferred to refill team.   1. Which medications need to be refilled? (please list name of each medication and dose if known) losartan (COZAAR) 50 MG tablet   2. Which pharmacy/location (including street and city if local pharmacy) is medication to be sent to? [place], [address]  3. Do they need a 30 day or 90 day supply? [place]

## 2019-03-05 ENCOUNTER — Telehealth: Payer: Self-pay

## 2019-03-05 NOTE — Telephone Encounter (Signed)
Left message for patient to call the office to reschedule his office visit that was on May 5, to an evisit for another day.

## 2019-03-18 ENCOUNTER — Ambulatory Visit: Payer: Medicare HMO | Admitting: Cardiology

## 2019-06-06 ENCOUNTER — Other Ambulatory Visit: Payer: Self-pay | Admitting: *Deleted

## 2019-06-09 ENCOUNTER — Other Ambulatory Visit: Payer: Self-pay | Admitting: Cardiology

## 2019-06-09 MED ORDER — LOSARTAN POTASSIUM 50 MG PO TABS: 50.0000 mg | ORAL_TABLET | Freq: Every day | ORAL | 0 refills | Status: DC

## 2019-06-09 NOTE — Telephone Encounter (Signed)
New Message    *STAT* If patient is at the pharmacy, call can be transferred to refill team.  1. Which medications need to be refilled? (please list name of each medication and dose if known) losartan (COZAAR) 50 MG tablet  2. Which pharmacy/location (including street and city if local pharmacy) is medication to be sent to? [place], [address]  3. Do they need a 30 day or 90 day supply? 90 day supply    Patient is requesting a call back once the refill has been called in.

## 2019-07-15 DIAGNOSIS — I119 Hypertensive heart disease without heart failure: Secondary | ICD-10-CM | POA: Diagnosis not present

## 2019-07-15 DIAGNOSIS — I4819 Other persistent atrial fibrillation: Secondary | ICD-10-CM | POA: Diagnosis not present

## 2019-07-15 DIAGNOSIS — R7303 Prediabetes: Secondary | ICD-10-CM | POA: Diagnosis not present

## 2019-07-15 DIAGNOSIS — E782 Mixed hyperlipidemia: Secondary | ICD-10-CM | POA: Diagnosis not present

## 2019-07-15 DIAGNOSIS — Z1389 Encounter for screening for other disorder: Secondary | ICD-10-CM | POA: Diagnosis not present

## 2019-07-15 DIAGNOSIS — Z125 Encounter for screening for malignant neoplasm of prostate: Secondary | ICD-10-CM | POA: Diagnosis not present

## 2019-07-15 DIAGNOSIS — I5042 Chronic combined systolic (congestive) and diastolic (congestive) heart failure: Secondary | ICD-10-CM | POA: Diagnosis not present

## 2019-07-15 DIAGNOSIS — I2510 Atherosclerotic heart disease of native coronary artery without angina pectoris: Secondary | ICD-10-CM | POA: Diagnosis not present

## 2019-07-15 DIAGNOSIS — Z0000 Encounter for general adult medical examination without abnormal findings: Secondary | ICD-10-CM | POA: Diagnosis not present

## 2019-07-15 DIAGNOSIS — D6869 Other thrombophilia: Secondary | ICD-10-CM | POA: Diagnosis not present

## 2019-07-20 NOTE — Progress Notes (Signed)
[name] Date of Birth: Jul 30, 1946   History of Present Illness: [name] is seen for  followup CAD. He has a history of coronary disease and is status post CABG with a Maze procedure in 2007 by Dr. Prescott Gum. Prior to CABG EF was 35-40%. CABG included an LIMA graft to LAD, saphenous vein graft to the obtuse marginal vessel, and saphenous vein graft sequentially to the PDA and posterior lateral branches of the right coronary. He quit smoking in 2007.   He was seen in the ED on 12/22/16 for symptoms of dyspnea on exertion, dry cough, chest pain and mid scapular pain. Ecg showed NSR with PVCs, prolonged QT. No acute ischemic changes. He had some edema. BNP elevated at 489. CXR showed mild vascular congestion and effusions. CTA showed no pulmonary embolus. There was enlargement of the thoracic aorta to 4 cm. He was sent home on lasix and potassium. Follow up Echo showed EF 35-40%. Myoview showed evidence of old inferior infarct. No ischemia. EF 45%. He was started on losartan 25 mg daily- this was increased on his last visit.  On follow up today he reports he is doing well. He does state that he can no longer afford Xarelto. Needs to go on Coumadin. He did take this previously after his CABG.  He denies any chest pain, dyspnea, palpitations, orthopnea, or PND. Still fairly sedentary but does work around the house.   Current Outpatient Medications on File Prior to Visit  Medication Sig Dispense Refill  . atorvastatin (LIPITOR) 40 MG tablet Take 40 mg by mouth daily.    . carvedilol (COREG) 6.25 MG tablet Take 1 tablet (6.25 mg total) by mouth 2 (two) times daily. 60 tablet 11  . Coenzyme Q10 200 MG capsule Take 200 mg by mouth every other day.    . furosemide (LASIX) 20 MG tablet Take 1 tablet (20 mg total) by mouth daily. 90 tablet 1  . losartan (COZAAR) 50 MG tablet Take 1 tablet (50 mg total) by mouth daily. Keep scheduled appointment 90 tablet 0  . Milk Thistle 250 MG CAPS Take 1 capsule by  mouth every other day.    . potassium chloride SA (K-DUR,KLOR-CON) 20 MEQ tablet Take 1 tablet (20 mEq total) by mouth daily. 90 tablet 1  . rivaroxaban (XARELTO) 20 MG TABS tablet Take 1 tablet (20 mg total) by mouth daily with supper. 30 tablet 11  . Ascorbic Acid (VITAMIN C) 1000 MG tablet Take 1,000 mg by mouth daily.      . cyanocobalamin 1000 MCG tablet Take 1,000 mcg by mouth daily.     No current facility-administered medications on file prior to visit.     No Known Allergies  Past Medical History:  Diagnosis Date  . AAA (abdominal aortic aneurysm) ([place])   . CAD (coronary artery disease) of artery bypass graft   . Coronary artery disease   . Dyslipidemia   . HTN (hypertension)   . Hyperlipidemia   . Hypertension   . Low back pain   . PAC (premature atrial contraction)   . Paroxysmal atrial flutter [facility])     Past Surgical History:  Procedure Laterality Date  . CORONARY ARTERY BYPASS GRAFT  2007  . CORONARY ARTERY BYPASS GRAFT    . LUMBAR DISC ARTHROPLASTY    . MAZE  2007   MAZE PROCEDURE  . TONSILLECTOMY      Social History   Tobacco Use  Smoking Status Former Smoker  . Packs/day: 1.00  .  Years: 30.00  . Pack years: 30.00  . Types: Cigarettes  . Quit date: 11/13/2005  . Years since quitting: 13.7  Smokeless Tobacco Never Used    Social History   Substance and Sexual Activity  Alcohol Use No    Family History  Problem Relation Age of Onset  . Diabetes Mother   . Heart disease Father     Review of Systems: As noted in history of present illness. All other systems are reviewed and are negative.   Physical Exam: BP 124/78   Pulse 67   Temp (!) 97.1 F (36.2 C)   Ht 5' 6.5" (1.689 m)   Wt 198 lb (89.8 kg)   SpO2 93%   BMI 31.48 kg/m  GENERAL:  Well appearing WM in NAD HEENT:  PERRL, EOMI, sclera are clear. Oropharynx is clear. NECK:  No jugular venous distention, carotid upstroke brisk and symmetric, no bruits, no thyromegaly or  adenopathy LUNGS:  Clear to auscultation bilaterally CHEST:  Unremarkable HEART:  IRRR,  PMI not displaced or sustained,S1 and S2 within normal limits, no S3, no S4: no clicks, no rubs, no murmurs ABD:  Soft, nontender. BS +, no masses or bruits. No hepatomegaly, no splenomegaly EXT:  2 + pulses throughout, no edema, no cyanosis no clubbing SKIN:  Warm and dry.  No rashes NEURO:  Alert and oriented x 3. Cranial nerves II through XII intact. PSYCH:  Cognitively intact     LABORATORY DATA: Lab Results  Component Value Date   WBC 7.1 12/22/2016   HGB 13.1 12/22/2016   HCT 39.4 12/22/2016   PLT 204 12/22/2016   GLUCOSE 112 (H) 12/25/2018   CHOL 138 01/08/2017   TRIG 88 01/08/2017   HDL 46 01/08/2017   LDLDIRECT 124.1 02/07/2012   LDLCALC 74 01/08/2017   ALT 25 01/08/2017   AST 21 01/08/2017   NA 138 12/25/2018   K 4.3 12/25/2018   CL 101 12/25/2018   CREATININE 0.89 12/25/2018   BUN 15 12/25/2018   CO2 21 12/25/2018   INR 0.96 08/02/2011   Labs dated 05/24/16: cholesterol 155, triglycerides 124, HDL 48, LDL 82.  Dated 05/30/17: cholesterol 160, triglycerides 144, HDL 48, LDL 84, Hgb 14.5. Creatinine 0.8, ALt normal Dated 06/25/18: cholesterol 141, triglycerides 92, HDL 52, LDL 70. Normal chemistry and CBC.  Dated 07/15/19: A1c 5.9%. cholesterol 137, triglycerides 109, HDL 50, LDL 65. CMET and CBC normal.   Ecg today showed AFib with rate 65. Otherwise normal. I have personally reviewed and interpreted this study.   Study Result   Echo 01/29/17: Study Conclusions  - Left ventricle: The cavity size was normal. Wall thickness was   normal. Systolic function was moderately reduced. The estimated   ejection fraction was in the range of 35% to 40%. Moderate   diffuse hypokinesis with distinct regional wall motion   abnormalities. - Mitral valve: Calcified annulus. - Left atrium: The atrium was severely dilated. - Right atrium: The atrium was mildly dilated.  Myoview 01/17/17:  Study Highlights     Nuclear stress EF: 45%.  Blood pressure demonstrated a hypertensive response to exercise.  There was no ST segment deviation noted during stress.  Findings consistent with prior myocardial infarction.  This is an intermediate risk study.  The left ventricular ejection fraction is mildly decreased (45-54%).   Small area of inferior wall infarct at mid and basal level no ischemia EF 45% apical hypokinesis Frequent ventricular ectopy during stress and recovery  HTN response to exercise  CT ANGIOGRAPHY CHEST WITH CONTRAST  TECHNIQUE: Multidetector CT imaging of the chest was performed using the standard protocol during bolus administration of intravenous contrast. Multiplanar CT image reconstructions and MIPs were obtained to evaluate the vascular anatomy.  CONTRAST:  65mL ISOVUE-370 IOPAMIDOL (ISOVUE-370) INJECTION 76%  COMPARISON:  12/31/2017  FINDINGS: Cardiovascular: Maximal diameter of the ascending aorta at the sinus of Valsalva, sino-tubular junction, and ascending aorta are 4.0 cm, 3.0 cm, and 3.7 cm. This compares with a maximal diameter of the ascending aorta of 4.0 cm on the prior study. There is no evidence of aortic dissection. There is no obvious acute intramural hematoma. Atherosclerotic calcifications of the aortic arch and great vessels are noted. Visualized great vessels are patent. Postoperative changes from CABG are noted.  Mediastinum/Nodes: No abnormal mediastinal adenopathy or pericardial effusion. The thyroid is heterogeneous without discrete focal nodule. Esophagus is within normal limits.  Lungs/Pleura: Apical blebs are noted. Subpleural nodules in the right hemithorax are stable supporting benign etiology. Scarring versus atelectasis at the medial right lung base. No pneumothorax or pleural effusion.  Upper Abdomen: No acute abnormality.  Musculoskeletal: No vertebral compression deformity. No acute  rib fracture. Sternotomy has healed with sternal wires in place.  Review of the MIP images confirms the above findings.  IMPRESSION: Maximal diameter of the ascending aorta is 3.7 cm. This is nonaneurysmal. Postoperative and atherosclerotic changes are noted.  Aortic Atherosclerosis (ICD10-I70.0).   Electronically Signed   By: Marybelle Killings M.D.   On: 01/01/2019 09:48  Abdominal US 12/30/18: Summary: Abdominal Aorta: The largest aortic measurement is 2.8 cm. The largest aortic diameter remains essentially unchanged compared to prior exam. Previous diameter measurement was 2.9 cm obtained on 05/2015.     Assessment / Plan: 1. Chronic combined systolic and diastolic CHF. EF 35-40% by Echo. 45% by Myoview.Continue Coreg and losartan. He is asymptomatic.   Continue sodium restriction. He is euvolemic.  Stressed importance of daily aerobic exercise.  2. CAD s/p CABG 2007. On ASA, statin, beta blocker. Myoview in March 2018 showed inferior infarct without ischemia. He is asymptomatic.   3. Atrial fibrillation. Persistent.   He is asymptomatic. [place] Vasc score of 4. Recommend continued anticoagulation. Unable to afford DOAC. Will refer to pharmacy to transition to coumadin.  Continue rate control.   3. Hyperlipidemia on statin. Well controlled.   4. HTN controlled.  5. AAA. 2.9 cm by [place] in 2016 and 2.8 cm by [place] in February 2020.  6. Mild thoracic aneurysm 3.7 cm. Recommend CT yearly. Last study in February 2020

## 2019-07-25 ENCOUNTER — Encounter: Payer: Self-pay | Admitting: Cardiology

## 2019-07-25 ENCOUNTER — Ambulatory Visit: Payer: Medicare HMO | Admitting: Cardiology

## 2019-07-25 ENCOUNTER — Other Ambulatory Visit: Payer: Self-pay

## 2019-07-25 VITALS — BP 124/78 | HR 67 | Temp 97.1°F | Ht 66.5 in | Wt 198.0 lb

## 2019-07-25 DIAGNOSIS — I4819 Other persistent atrial fibrillation: Secondary | ICD-10-CM | POA: Diagnosis not present

## 2019-07-25 DIAGNOSIS — I25709 Atherosclerosis of coronary artery bypass graft(s), unspecified, with unspecified angina pectoris: Secondary | ICD-10-CM

## 2019-07-25 DIAGNOSIS — I2510 Atherosclerotic heart disease of native coronary artery without angina pectoris: Secondary | ICD-10-CM

## 2019-07-25 MED ORDER — ATORVASTATIN CALCIUM 40 MG PO TABS: 40.0000 mg | ORAL_TABLET | Freq: Every day | ORAL | 3 refills | Status: DC

## 2019-07-25 MED ORDER — CARVEDILOL 6.25 MG PO TABS: 6.2500 mg | ORAL_TABLET | Freq: Two times a day (BID) | ORAL | 3 refills | Status: DC

## 2019-07-25 MED ORDER — POTASSIUM CHLORIDE CRYS ER 20 MEQ PO TBCR: 20.0000 meq | EXTENDED_RELEASE_TABLET | Freq: Every day | ORAL | 3 refills | Status: DC

## 2019-07-25 MED ORDER — LOSARTAN POTASSIUM 50 MG PO TABS: 50.0000 mg | ORAL_TABLET | Freq: Every day | ORAL | 3 refills | Status: DC

## 2019-07-25 MED ORDER — FUROSEMIDE 20 MG PO TABS: 20.0000 mg | ORAL_TABLET | Freq: Every day | ORAL | 3 refills | Status: DC

## 2019-07-28 ENCOUNTER — Other Ambulatory Visit: Payer: Self-pay

## 2019-07-28 DIAGNOSIS — I5042 Chronic combined systolic (congestive) and diastolic (congestive) heart failure: Secondary | ICD-10-CM

## 2019-07-28 DIAGNOSIS — I4819 Other persistent atrial fibrillation: Secondary | ICD-10-CM

## 2019-08-14 ENCOUNTER — Ambulatory Visit (INDEPENDENT_AMBULATORY_CARE_PROVIDER_SITE_OTHER): Payer: Medicare HMO | Admitting: Pharmacist

## 2019-08-14 ENCOUNTER — Other Ambulatory Visit: Payer: Self-pay

## 2019-08-14 DIAGNOSIS — Z7901 Long term (current) use of anticoagulants: Secondary | ICD-10-CM

## 2019-08-14 DIAGNOSIS — I4892 Unspecified atrial flutter: Secondary | ICD-10-CM

## 2019-08-14 DIAGNOSIS — I4891 Unspecified atrial fibrillation: Secondary | ICD-10-CM

## 2019-08-14 LAB — POCT INR: INR: 1.1 — AB (ref 2.0–3.0)

## 2019-08-14 MED ORDER — WARFARIN SODIUM 5 MG PO TABS: 5.0000 mg | ORAL_TABLET | Freq: Every day | ORAL | 0 refills | Status: DC

## 2019-08-25 ENCOUNTER — Other Ambulatory Visit: Payer: Self-pay | Admitting: Pharmacist Clinician (PhC)/ Clinical Pharmacy Specialist

## 2019-08-25 ENCOUNTER — Ambulatory Visit (INDEPENDENT_AMBULATORY_CARE_PROVIDER_SITE_OTHER): Payer: Medicare HMO | Admitting: Pharmacist Clinician (PhC)/ Clinical Pharmacy Specialist

## 2019-08-25 ENCOUNTER — Other Ambulatory Visit: Payer: Self-pay

## 2019-08-25 DIAGNOSIS — Z7901 Long term (current) use of anticoagulants: Secondary | ICD-10-CM

## 2019-08-25 DIAGNOSIS — I4891 Unspecified atrial fibrillation: Secondary | ICD-10-CM

## 2019-08-25 DIAGNOSIS — I4892 Unspecified atrial flutter: Secondary | ICD-10-CM

## 2019-08-25 LAB — POCT INR: INR: 2 (ref 2.0–3.0)

## 2019-09-01 ENCOUNTER — Ambulatory Visit (INDEPENDENT_AMBULATORY_CARE_PROVIDER_SITE_OTHER): Payer: Medicare HMO | Admitting: Pharmacist Clinician (PhC)/ Clinical Pharmacy Specialist

## 2019-09-01 ENCOUNTER — Other Ambulatory Visit: Payer: Self-pay

## 2019-09-01 ENCOUNTER — Telehealth: Payer: Self-pay

## 2019-09-01 DIAGNOSIS — I4891 Unspecified atrial fibrillation: Secondary | ICD-10-CM | POA: Diagnosis not present

## 2019-09-01 DIAGNOSIS — Z7901 Long term (current) use of anticoagulants: Secondary | ICD-10-CM

## 2019-09-01 DIAGNOSIS — I4892 Unspecified atrial flutter: Secondary | ICD-10-CM

## 2019-09-01 LAB — POCT INR: INR: 1.8 — AB (ref 2.0–3.0)

## 2019-09-01 NOTE — Patient Instructions (Signed)
Take 1.5 tablets today Monday Oct 19, then increase dose to 1 tablet daily except 1/2 tablet each Wednesday.  Repeat INR in 1 week

## 2019-09-01 NOTE — Telephone Encounter (Signed)
ROI fax to [facility] for records

## 2019-09-04 ENCOUNTER — Other Ambulatory Visit: Payer: Self-pay

## 2019-09-04 ENCOUNTER — Ambulatory Visit: Payer: Medicare HMO | Admitting: Gastroenterology

## 2019-09-04 ENCOUNTER — Encounter: Payer: Self-pay | Admitting: Gastroenterology

## 2019-09-04 VITALS — BP 130/80 | HR 66 | Temp 98.0°F | Ht 65.5 in | Wt 202.2 lb

## 2019-09-04 DIAGNOSIS — Z7901 Long term (current) use of anticoagulants: Secondary | ICD-10-CM | POA: Diagnosis not present

## 2019-09-04 DIAGNOSIS — Z86010 Personal history of colonic polyps: Secondary | ICD-10-CM

## 2019-09-04 NOTE — Progress Notes (Signed)
HPI :  73 year old male with a history of atrial fibrillation on chronic Coumadin, AAA, CAD status post CABG, referred by Dr. Maury Dus to discuss surveillance colonoscopy.  The patient states his last colonoscopy was done by [facility] about 5 years ago, he thinks he had a polyp removed and was told to follow-up again in 5 years for another colonoscopy.  Records of that exam are not available today.  He denies any problems with his bowels that bother him.  No routine constipation or diarrhea that bothers him.  He denies any blood in his stools.  No abdominal pains.  No weight loss.  He eats well, denies any digestive issues.  He has no complaints today. No FH of colon cancer. Sister has celiac disease.  He previously was on Xarelto but was recently transitioned to Coumadin for his atrial fibrillation.  He denies any cardiopulmonary complaints.  His last echocardiogram was performed in 2018 at which point his EF was 35 to 40%, by myoview it was 45%.  Echo 01/28/17 - EF 35-40%, 45% by Brantley Fling  Past Medical History:  Diagnosis Date  . AAA (abdominal aortic aneurysm) ([place])   . Arthritis   . CAD (coronary artery disease) of artery bypass graft   . Colon polyp 2015  . Coronary artery disease   . Diarrhea   . Diverticulosis   . Dyslipidemia   . Hemorrhoids   . HTN (hypertension)   . Hyperlipidemia   . Hypertension   . Low back pain   . PAC (premature atrial contraction)   . Paroxysmal atrial flutter ([place])   . Problems with hearing      Past Surgical History:  Procedure Laterality Date  . CORONARY ARTERY BYPASS GRAFT  2007  . CORONARY ARTERY BYPASS GRAFT    . LUMBAR DISC ARTHROPLASTY    . MAZE  2007   MAZE PROCEDURE  . TONSILLECTOMY     Family History  Problem Relation Age of Onset  . Diabetes Mother   . Heart disease Father    Social History   Tobacco Use  . Smoking status: Former Smoker    Packs/day: 1.00    Years: 30.00    Pack years: 30.00    Types: Cigarettes   Quit date: 11/13/2005    Years since quitting: 13.8  . Smokeless tobacco: Never Used  Substance Use Topics  . Alcohol use: No  . Drug use: No   Current Outpatient Medications  Medication Sig Dispense Refill  . Ascorbic Acid (VITAMIN C) 1000 MG tablet Take 1,000 mg by mouth daily.      Marland Kitchen atorvastatin (LIPITOR) 40 MG tablet Take 1 tablet (40 mg total) by mouth daily. 90 tablet 3  . carvedilol (COREG) 6.25 MG tablet Take 1 tablet (6.25 mg total) by mouth 2 (two) times daily. 180 tablet 3  . Coenzyme Q10 200 MG capsule Take 200 mg by mouth every other day.    . cyanocobalamin 1000 MCG tablet Take 1,000 mcg by mouth daily.    . furosemide (LASIX) 20 MG tablet Take 1 tablet (20 mg total) by mouth daily. 90 tablet 3  . losartan (COZAAR) 50 MG tablet Take 1 tablet (50 mg total) by mouth daily. 90 tablet 3  . Milk Thistle 250 MG CAPS Take 1 capsule by mouth every other day.    . potassium chloride SA (K-DUR) 20 MEQ tablet Take 1 tablet (20 mEq total) by mouth daily. 90 tablet 3  . warfarin (COUMADIN) 5 MG  tablet Take 1 tablet (5 mg total) by mouth daily. 30 tablet 0   No current facility-administered medications for this visit.    No Known Allergies   Review of Systems: All systems reviewed and negative except where noted in HPI.   Lab Results  Component Value Date   WBC 7.1 12/22/2016   HGB 13.1 12/22/2016   HCT 39.4 12/22/2016   MCV 91.6 12/22/2016   PLT 204 12/22/2016    Lab Results  Component Value Date   CREATININE 0.89 12/25/2018   BUN 15 12/25/2018   NA 138 12/25/2018   K 4.3 12/25/2018   CL 101 12/25/2018   CO2 21 12/25/2018    Lab Results  Component Value Date   ALT 25 01/08/2017   AST 21 01/08/2017   ALKPHOS 79 01/08/2017   BILITOT 0.7 01/08/2017    Lab Results  Component Value Date   INR 1.8 (A) 09/01/2019   INR 2.0 08/25/2019   INR 1.1 (A) 08/14/2019     Physical Exam: BP 130/80 (BP Location: Left Arm, Patient Position: Sitting, Cuff Size: Normal)    Pulse 66   Temp 98 F (36.7 C) (Oral)   Ht 5' 5.5" (1.664 m)   Wt 202 lb 4 oz (91.7 kg)   BMI 33.14 kg/m  Constitutional: Pleasant,well-developed, male in no acute distress. HEENT: Normocephalic and atraumatic. Conjunctivae are normal. No scleral icterus. Neck supple.  Cardiovascular: Normal rate, regular rhythm.  Pulmonary/chest: Effort normal and breath sounds normal. No wheezing, rales or rhonchi. Abdominal: Soft, nondistended / protuberant, nontender. there are no masses palpable. No hepatomegaly. Extremities: no edema Lymphadenopathy: No cervical adenopathy noted. Neurological: Alert and oriented to person place and time. Skin: Skin is warm and dry. No rashes noted. Psychiatric: Normal mood and affect. Behavior is normal.   ASSESSMENT AND PLAN: 73 year old male here for new patient assessment for the following:  History of colon polyps / anticoagulated - he reports being due for a surveillance colonoscopy although we do not have records of his prior colonoscopy.  We have requested those records so we can clarify findings and determine when he is next due for his exam.  Otherwise he is asymptomatic, no anemia.  He has a history of atrial fibrillation on chronic Coumadin.  We discussed colonoscopy in general, risks and benefits of anesthesia.  We will need approval to hold his Coumadin for 5 days prior to the exam if he is indeed due for a colonoscopy.  He was agreeable to this if we proceed at this time.  Once we get the results of his colonoscopy back we will contact him to discuss timing of his exam and obtain clearance from his cardiologist hold Coumadin if needed.  He agreed  [place] Cellar, MD [facility]  CC: Maury Dus, MD

## 2019-09-08 ENCOUNTER — Ambulatory Visit (INDEPENDENT_AMBULATORY_CARE_PROVIDER_SITE_OTHER): Payer: Medicare HMO | Admitting: Pharmacist

## 2019-09-08 ENCOUNTER — Other Ambulatory Visit: Payer: Self-pay

## 2019-09-08 DIAGNOSIS — I4892 Unspecified atrial flutter: Secondary | ICD-10-CM

## 2019-09-08 DIAGNOSIS — I4891 Unspecified atrial fibrillation: Secondary | ICD-10-CM

## 2019-09-08 DIAGNOSIS — Z7901 Long term (current) use of anticoagulants: Secondary | ICD-10-CM | POA: Diagnosis not present

## 2019-09-08 LAB — POCT INR: INR: 1.8 — AB (ref 2.0–3.0)

## 2019-09-08 MED ORDER — WARFARIN SODIUM 5 MG PO TABS: 5.0000 mg | ORAL_TABLET | Freq: Every day | ORAL | 0 refills | Status: DC

## 2019-09-18 NOTE — Telephone Encounter (Signed)
Rec'd from [facility] forwarded 16 pages to [facility] Provider Historical

## 2019-09-22 ENCOUNTER — Other Ambulatory Visit: Payer: Self-pay

## 2019-09-22 ENCOUNTER — Ambulatory Visit (INDEPENDENT_AMBULATORY_CARE_PROVIDER_SITE_OTHER): Payer: Medicare HMO | Admitting: Pharmacist

## 2019-09-22 DIAGNOSIS — I4892 Unspecified atrial flutter: Secondary | ICD-10-CM

## 2019-09-22 DIAGNOSIS — Z7901 Long term (current) use of anticoagulants: Secondary | ICD-10-CM | POA: Diagnosis not present

## 2019-09-22 DIAGNOSIS — I4891 Unspecified atrial fibrillation: Secondary | ICD-10-CM

## 2019-09-22 LAB — POCT INR: INR: 2.2 (ref 2.0–3.0)

## 2019-09-30 ENCOUNTER — Telehealth: Payer: Self-pay | Admitting: Gastroenterology

## 2019-09-30 NOTE — Telephone Encounter (Signed)
Records from prior colonoscopy reviewed as outlined:  Colonoscopy 04/23/2014 - Dr. Michail Sermon - 5 polyps removed in the rectum / sigmoid colon c/w hyperplastic polyps, one 52mm rectal polyp c/w adenoma. Diverticulosis and hemorrhoids noted. Prep listed as "good".  At the time the patient was recommended to have a follow up colonoscopy in 5 years for surveillance. Since that time guidelines have changed, and would argue his next exam is not due for 7 years until his last exam, so around 04/2021.   Sherlynn Stalls can you please relay the findings. I think reasonable to do his next exam around 04/2021 based on updated guidelines; however if he wants to proceed sooner for piece of mind given his other GI physician had recommended 5 years, I can understand that if he is anxious about it, whichever his preference, but I think it okay to wait. If he does want to proceed now he will need to hold his Coumadin for 5 days and get approval for that. Thanks

## 2019-10-01 ENCOUNTER — Telehealth: Payer: Self-pay

## 2019-10-01 NOTE — Telephone Encounter (Signed)
Left message to please call back. °

## 2019-10-01 NOTE — Telephone Encounter (Signed)
Patient called back, I reviewed the results of his colonoscopy from 04/23/2014 and the guidelines for a repeat colonoscopy. He wants to talk to his wife and call us back on whether he wants to have It done at 5 years or 7 years

## 2019-10-15 ENCOUNTER — Other Ambulatory Visit: Payer: Self-pay

## 2019-10-15 ENCOUNTER — Ambulatory Visit (INDEPENDENT_AMBULATORY_CARE_PROVIDER_SITE_OTHER): Payer: Medicare HMO | Admitting: Pharmacist Clinician (PhC)/ Clinical Pharmacy Specialist

## 2019-10-15 DIAGNOSIS — I4892 Unspecified atrial flutter: Secondary | ICD-10-CM

## 2019-10-15 DIAGNOSIS — I4891 Unspecified atrial fibrillation: Secondary | ICD-10-CM | POA: Diagnosis not present

## 2019-10-15 DIAGNOSIS — Z7901 Long term (current) use of anticoagulants: Secondary | ICD-10-CM

## 2019-10-15 LAB — POCT INR: INR: 1.4 — AB (ref 2.0–3.0)

## 2019-10-29 ENCOUNTER — Ambulatory Visit (INDEPENDENT_AMBULATORY_CARE_PROVIDER_SITE_OTHER): Payer: Medicare HMO | Admitting: Pharmacist Clinician (PhC)/ Clinical Pharmacy Specialist

## 2019-10-29 ENCOUNTER — Other Ambulatory Visit: Payer: Self-pay

## 2019-10-29 DIAGNOSIS — Z7901 Long term (current) use of anticoagulants: Secondary | ICD-10-CM

## 2019-10-29 DIAGNOSIS — I4891 Unspecified atrial fibrillation: Secondary | ICD-10-CM

## 2019-10-29 DIAGNOSIS — I4892 Unspecified atrial flutter: Secondary | ICD-10-CM

## 2019-10-29 LAB — POCT INR: INR: 1.7 — AB (ref 2.0–3.0)

## 2019-10-29 MED ORDER — WARFARIN SODIUM 5 MG PO TABS: 5.0000 mg | ORAL_TABLET | Freq: Every day | ORAL | 1 refills | Status: DC

## 2019-11-12 ENCOUNTER — Ambulatory Visit (INDEPENDENT_AMBULATORY_CARE_PROVIDER_SITE_OTHER): Payer: Medicare HMO | Admitting: Pharmacist

## 2019-11-12 ENCOUNTER — Other Ambulatory Visit: Payer: Self-pay

## 2019-11-12 DIAGNOSIS — Z7901 Long term (current) use of anticoagulants: Secondary | ICD-10-CM | POA: Diagnosis not present

## 2019-11-12 DIAGNOSIS — I4891 Unspecified atrial fibrillation: Secondary | ICD-10-CM

## 2019-11-12 DIAGNOSIS — I4892 Unspecified atrial flutter: Secondary | ICD-10-CM | POA: Diagnosis not present

## 2019-11-12 LAB — POCT INR: INR: 2.3 (ref 2.0–3.0)

## 2019-12-03 ENCOUNTER — Other Ambulatory Visit: Payer: Self-pay

## 2019-12-03 ENCOUNTER — Encounter: Payer: Self-pay | Admitting: Pharmacist

## 2019-12-03 ENCOUNTER — Ambulatory Visit (INDEPENDENT_AMBULATORY_CARE_PROVIDER_SITE_OTHER): Payer: Medicare HMO | Admitting: Pharmacist

## 2019-12-03 DIAGNOSIS — I4892 Unspecified atrial flutter: Secondary | ICD-10-CM

## 2019-12-03 DIAGNOSIS — Z7901 Long term (current) use of anticoagulants: Secondary | ICD-10-CM

## 2019-12-03 DIAGNOSIS — I4891 Unspecified atrial fibrillation: Secondary | ICD-10-CM | POA: Diagnosis not present

## 2019-12-03 LAB — POCT INR: INR: 2.1 (ref 2.0–3.0)

## 2019-12-03 MED ORDER — WARFARIN SODIUM 5 MG PO TABS: 5.0000 mg | ORAL_TABLET | Freq: Every day | ORAL | 1 refills | Status: DC

## 2019-12-31 ENCOUNTER — Ambulatory Visit (INDEPENDENT_AMBULATORY_CARE_PROVIDER_SITE_OTHER): Payer: Medicare HMO | Admitting: Pharmacist

## 2019-12-31 ENCOUNTER — Other Ambulatory Visit: Payer: Self-pay

## 2019-12-31 DIAGNOSIS — Z7901 Long term (current) use of anticoagulants: Secondary | ICD-10-CM

## 2019-12-31 DIAGNOSIS — I4891 Unspecified atrial fibrillation: Secondary | ICD-10-CM

## 2019-12-31 DIAGNOSIS — I4892 Unspecified atrial flutter: Secondary | ICD-10-CM | POA: Diagnosis not present

## 2019-12-31 LAB — POCT INR: INR: 1.9 — AB (ref 2.0–3.0)

## 2020-01-04 ENCOUNTER — Ambulatory Visit: Payer: Medicare HMO | Attending: Internal Medicine

## 2020-01-04 DIAGNOSIS — Z23 Encounter for immunization: Secondary | ICD-10-CM | POA: Insufficient documentation

## 2020-01-04 NOTE — Progress Notes (Signed)
   Covid-19 Vaccination Clinic  Name:  [name]    MRN: [id number] DOB: 12-07-1945  01/04/2020  [name] was observed post Covid-19 immunization for 15 minutes without incidence. He was provided with Vaccine Information Sheet and instruction to access the V-Safe system.   [name] was instructed to call 911 with any severe reactions post vaccine: Marland Kitchen Difficulty breathing  . Swelling of your face and throat  . A fast heartbeat  . A bad rash all over your body  . Dizziness and weakness    Immunizations Administered    Name Date Dose VIS Date Route   Pfizer COVID-19 Vaccine 01/04/2020  9:12 AM 0.3 mL 10/24/2019 Intramuscular   Manufacturer: [place]   Lot: [id number]   [place]: [id number]

## 2020-01-27 ENCOUNTER — Ambulatory Visit: Payer: Medicare HMO | Attending: Internal Medicine

## 2020-01-27 DIAGNOSIS — Z23 Encounter for immunization: Secondary | ICD-10-CM

## 2020-01-27 NOTE — Progress Notes (Signed)
   Covid-19 Vaccination Clinic  Name:  [name]    MRN: [id number] DOB: 08-16-1946  01/27/2020  [name] was observed post Covid-19 immunization for 15 minutes without incident. He was provided with Vaccine Information Sheet and instruction to access the V-Safe system.   [name] was instructed to call 911 with any severe reactions post vaccine: Marland Kitchen Difficulty breathing  . Swelling of face and throat  . A fast heartbeat  . A bad rash all over body  . Dizziness and weakness   Immunizations Administered    Name Date Dose VIS Date Route   Pfizer COVID-19 Vaccine 01/27/2020  3:21 PM 0.3 mL 10/24/2019 Intramuscular   Manufacturer: [place]   Lot: [id number]   [place]: [id number]

## 2020-01-29 NOTE — Progress Notes (Signed)
[name] Date of Birth: Apr 01, 1946   History of Present Illness: [name] is seen for  followup CAD. He has a history of coronary disease and is status post CABG with a Maze procedure in 2007 by Dr. Prescott Gum. Prior to CABG EF was 35-40%. CABG included an LIMA graft to LAD, saphenous vein graft to the obtuse marginal vessel, and saphenous vein graft sequentially to the PDA and posterior lateral branches of the right coronary. He quit smoking in 2007.   He was seen in the ED on 12/22/16 for symptoms of dyspnea on exertion, dry cough, chest pain and mid scapular pain. Ecg showed NSR with PVCs, prolonged QT. No acute ischemic changes. He had some edema. BNP elevated at 489. CXR showed mild vascular congestion and effusions. CTA showed no pulmonary embolus. There was enlargement of the thoracic aorta to 4 cm. He was sent home on lasix and potassium. Follow up Echo showed EF 35-40%. Myoview showed evidence of old inferior infarct. No ischemia. EF 45%. He was started on losartan 25 mg daily- this was increased on his last visit.  On follow up today he reports he is doing well. He is on Coumadin now due to cost.  He denies any chest pain, dyspnea, palpitations, orthopnea, or PND. Still fairly sedentary. Has gained about 8 lbs. No edema. No claudication.   Current Outpatient Medications on File Prior to Visit  Medication Sig Dispense Refill  . atorvastatin (LIPITOR) 40 MG tablet Take 1 tablet (40 mg total) by mouth daily. 90 tablet 3  . carvedilol (COREG) 6.25 MG tablet Take 1 tablet (6.25 mg total) by mouth 2 (two) times daily. 180 tablet 3  . furosemide (LASIX) 20 MG tablet Take 1 tablet (20 mg total) by mouth daily. 90 tablet 3  . losartan (COZAAR) 50 MG tablet Take 1 tablet (50 mg total) by mouth daily. 90 tablet 3  . potassium chloride SA (K-DUR) 20 MEQ tablet Take 1 tablet (20 mEq total) by mouth daily. 90 tablet 3  . warfarin (COUMADIN) 5 MG tablet Take 1 tablet (5 mg total) by mouth daily.  Take 1 to 1 and 1/2 daily as directed by coumadin clinic. 45 tablet 1   No current facility-administered medications on file prior to visit.    No Known Allergies  Past Medical History:  Diagnosis Date  . AAA (abdominal aortic aneurysm) ([place])   . Arthritis   . CAD (coronary artery disease) of artery bypass graft   . Colon polyp 2015  . Coronary artery disease   . Diarrhea   . Diverticulosis   . Dyslipidemia   . Hemorrhoids   . HTN (hypertension)   . Hyperlipidemia   . Hypertension   . Low back pain   . PAC (premature atrial contraction)   . Paroxysmal atrial flutter ([place])   . Problems with hearing     Past Surgical History:  Procedure Laterality Date  . CORONARY ARTERY BYPASS GRAFT  2007  . CORONARY ARTERY BYPASS GRAFT    . LUMBAR DISC ARTHROPLASTY    . MAZE  2007   MAZE PROCEDURE  . TONSILLECTOMY      Social History   Tobacco Use  Smoking Status Former Smoker  . Packs/day: 1.00  . Years: 30.00  . Pack years: 30.00  . Types: Cigarettes  . Quit date: 11/13/2005  . Years since quitting: 14.2  Smokeless Tobacco Never Used    Social History   Substance and Sexual Activity  Alcohol Use  No    Family History  Problem Relation Age of Onset  . Diabetes Mother   . Heart disease Father     Review of Systems: As noted in history of present illness. All other systems are reviewed and are negative.   Physical Exam: BP 134/64   Pulse 62   Ht 5\' 7"  (1.702 m)   Wt 207 lb (93.9 kg)   SpO2 95%   BMI 32.42 kg/m  GENERAL:  Well appearing WM in NAD HEENT:  PERRL, EOMI, sclera are clear. Oropharynx is clear. NECK:  No jugular venous distention, carotid upstroke brisk and symmetric, no bruits, no thyromegaly or adenopathy LUNGS:  Clear to auscultation bilaterally CHEST:  Unremarkable HEART:  IRRR,  PMI not displaced or sustained,S1 and S2 within normal limits, no S3, no S4: no clicks, no rubs, no murmurs ABD:  Soft, nontender. BS +, no masses or bruits. No  hepatomegaly, no splenomegaly EXT:  2 + pulses throughout, no edema, no cyanosis no clubbing SKIN:  Warm and dry.  No rashes NEURO:  Alert and oriented x 3. Cranial nerves II through XII intact. PSYCH:  Cognitively intact     LABORATORY DATA: Lab Results  Component Value Date   WBC 7.1 12/22/2016   HGB 13.1 12/22/2016   HCT 39.4 12/22/2016   PLT 204 12/22/2016   GLUCOSE 112 (H) 12/25/2018   CHOL 138 01/08/2017   TRIG 88 01/08/2017   HDL 46 01/08/2017   LDLDIRECT 124.1 02/07/2012   LDLCALC 74 01/08/2017   ALT 25 01/08/2017   AST 21 01/08/2017   NA 138 12/25/2018   K 4.3 12/25/2018   CL 101 12/25/2018   CREATININE 0.89 12/25/2018   BUN 15 12/25/2018   CO2 21 12/25/2018   INR 1.9 (A) 12/31/2019   Labs dated 05/24/16: cholesterol 155, triglycerides 124, HDL 48, LDL 82.  Dated 05/30/17: cholesterol 160, triglycerides 144, HDL 48, LDL 84, Hgb 14.5. Creatinine 0.8, ALt normal Dated 06/25/18: cholesterol 141, triglycerides 92, HDL 52, LDL 70. Normal chemistry and CBC.  Dated 07/15/19: A1c 5.9%. cholesterol 137, triglycerides 109, HDL 50, LDL 65. CMET and CBC normal.      Study Result   Echo 01/29/17: Study Conclusions  - Left ventricle: The cavity size was normal. Wall thickness was   normal. Systolic function was moderately reduced. The estimated   ejection fraction was in the range of 35% to 40%. Moderate   diffuse hypokinesis with distinct regional wall motion   abnormalities. - Mitral valve: Calcified annulus. - Left atrium: The atrium was severely dilated. - Right atrium: The atrium was mildly dilated.  Myoview 01/17/17: Study Highlights     Nuclear stress EF: 45%.  Blood pressure demonstrated a hypertensive response to exercise.  There was no ST segment deviation noted during stress.  Findings consistent with prior myocardial infarction.  This is an intermediate risk study.  The left ventricular ejection fraction is mildly decreased (45-54%).   Small area  of inferior wall infarct at mid and basal level no ischemia EF 45% apical hypokinesis Frequent ventricular ectopy during stress and recovery  HTN response to exercise    CT ANGIOGRAPHY CHEST WITH CONTRAST  TECHNIQUE: Multidetector CT imaging of the chest was performed using the standard protocol during bolus administration of intravenous contrast. Multiplanar CT image reconstructions and MIPs were obtained to evaluate the vascular anatomy.  CONTRAST:  38mL ISOVUE-370 IOPAMIDOL (ISOVUE-370) INJECTION 76%  COMPARISON:  12/31/2017  FINDINGS: Cardiovascular: Maximal diameter of the ascending aorta at  the sinus of Valsalva, sino-tubular junction, and ascending aorta are 4.0 cm, 3.0 cm, and 3.7 cm. This compares with a maximal diameter of the ascending aorta of 4.0 cm on the prior study. There is no evidence of aortic dissection. There is no obvious acute intramural hematoma. Atherosclerotic calcifications of the aortic arch and great vessels are noted. Visualized great vessels are patent. Postoperative changes from CABG are noted.  Mediastinum/Nodes: No abnormal mediastinal adenopathy or pericardial effusion. The thyroid is heterogeneous without discrete focal nodule. Esophagus is within normal limits.  Lungs/Pleura: Apical blebs are noted. Subpleural nodules in the right hemithorax are stable supporting benign etiology. Scarring versus atelectasis at the medial right lung base. No pneumothorax or pleural effusion.  Upper Abdomen: No acute abnormality.  Musculoskeletal: No vertebral compression deformity. No acute rib fracture. Sternotomy has healed with sternal wires in place.  Review of the MIP images confirms the above findings.  IMPRESSION: Maximal diameter of the ascending aorta is 3.7 cm. This is nonaneurysmal. Postoperative and atherosclerotic changes are noted.  Aortic Atherosclerosis (ICD10-I70.0).   Electronically Signed   By: Marybelle Killings M.D.    On: 01/01/2019 09:48  Abdominal US 12/30/18: Summary: Abdominal Aorta: The largest aortic measurement is 2.8 cm. The largest aortic diameter remains essentially unchanged compared to prior exam. Previous diameter measurement was 2.9 cm obtained on 05/2015.     Assessment / Plan: 1. Chronic combined systolic and diastolic CHF. EF 35-40% by Echo 2018. 45% by Myoview at the same time.Continue Coreg and losartan. He is asymptomatic.   Continue sodium restriction. He is euvolemic.  Stressed importance of daily aerobic exercise.  2. CAD s/p CABG 2007. On ASA, statin, beta blocker. Myoview in March 2018 showed inferior infarct without ischemia. He is asymptomatic.   3. Atrial fibrillation. Persistent/permanent.   He is asymptomatic. [place] Vasc score of 4. Recommend continued anticoagulation. Check INR today on coumadin  3. Hyperlipidemia on statin. Well controlled.   4. HTN controlled.  5. AAA. 2.9 cm by [place] in 2016 and 2.8 cm by [place] in February 2020. Will update CTA of chest and abdomen for both AAA and thoracic aneurysm.  6. Mild thoracic aneurysm 3.7 cm. Recommend CT yearly. Last study in February 2020

## 2020-02-04 ENCOUNTER — Ambulatory Visit: Payer: Medicare HMO | Admitting: Cardiology

## 2020-02-04 ENCOUNTER — Other Ambulatory Visit: Payer: Self-pay

## 2020-02-04 ENCOUNTER — Ambulatory Visit (INDEPENDENT_AMBULATORY_CARE_PROVIDER_SITE_OTHER): Payer: Medicare HMO | Admitting: Pharmacist Clinician (PhC)/ Clinical Pharmacy Specialist

## 2020-02-04 ENCOUNTER — Encounter: Payer: Self-pay | Admitting: Cardiology

## 2020-02-04 VITALS — BP 134/64 | HR 62 | Ht 67.0 in | Wt 207.0 lb

## 2020-02-04 DIAGNOSIS — Z7901 Long term (current) use of anticoagulants: Secondary | ICD-10-CM

## 2020-02-04 DIAGNOSIS — I25810 Atherosclerosis of coronary artery bypass graft(s) without angina pectoris: Secondary | ICD-10-CM

## 2020-02-04 DIAGNOSIS — I712 Thoracic aortic aneurysm, without rupture, unspecified: Secondary | ICD-10-CM

## 2020-02-04 DIAGNOSIS — I4891 Unspecified atrial fibrillation: Secondary | ICD-10-CM

## 2020-02-04 DIAGNOSIS — I5042 Chronic combined systolic (congestive) and diastolic (congestive) heart failure: Secondary | ICD-10-CM | POA: Diagnosis not present

## 2020-02-04 DIAGNOSIS — I714 Abdominal aortic aneurysm, without rupture, unspecified: Secondary | ICD-10-CM

## 2020-02-04 DIAGNOSIS — I4892 Unspecified atrial flutter: Secondary | ICD-10-CM

## 2020-02-04 DIAGNOSIS — I4819 Other persistent atrial fibrillation: Secondary | ICD-10-CM | POA: Diagnosis not present

## 2020-02-04 LAB — BASIC METABOLIC PANEL
BUN/Creatinine Ratio: 20 (ref 10–24)
BUN: 18 mg/dL (ref 8–27)
CO2: 24 mmol/L (ref 20–29)
Calcium: 9 mg/dL (ref 8.6–10.2)
Chloride: 102 mmol/L (ref 96–106)
Creatinine, Ser: 0.88 mg/dL (ref 0.76–1.27)
GFR calc Af Amer: 99 mL/min/{1.73_m2} (ref >59)
GFR calc non Af Amer: 85 mL/min/{1.73_m2} (ref >59)
Glucose: 99 mg/dL (ref 65–99)
Potassium: 4.4 mmol/L (ref 3.5–5.2)
Sodium: 139 mmol/L (ref 134–144)

## 2020-02-04 LAB — POCT INR: INR: 3.3 — AB (ref 2.0–3.0)

## 2020-02-04 NOTE — Patient Instructions (Signed)
Medication Instructions:  Continue same medications *If you need a refill on your cardiac medications before your next appointment, please call your pharmacy*   Lab Work: Bmet today    Testing/Procedures: Schedule Chest CT at [facility] office   Follow-Up: At [facility], you and your health needs are our priority.  As part of our continuing mission to provide you with exceptional heart care, we have created designated Provider Care Teams.  These Care Teams include your primary Cardiologist (physician) and Advanced Practice Providers (APPs -  Physician Assistants and Nurse Practitioners) who all work together to provide you with the care you need, when you need it.  We recommend signing up for the patient portal called "MyChart".  Sign up information is provided on this After Visit Summary.  MyChart is used to connect with patients for Virtual Visits (Telemedicine).  Patients are able to view lab/test results, encounter notes, upcoming appointments, etc.  Non-urgent messages can be sent to your provider as well.   To learn more about what you can do with MyChart, go to [email or web address].    Your next appointment:  6 months   Call in July to schedule Sept appointment    The format for your next appointment: Office    Provider: Dr.Jordan

## 2020-02-10 ENCOUNTER — Ambulatory Visit (INDEPENDENT_AMBULATORY_CARE_PROVIDER_SITE_OTHER)
Admission: RE | Admit: 2020-02-10 | Discharge: 2020-02-10 | Disposition: A | Discharge status: Home / Self Care | Payer: Medicare HMO | Source: Ambulatory Visit | Attending: Cardiology | Admitting: Cardiology

## 2020-02-10 ENCOUNTER — Other Ambulatory Visit: Payer: Self-pay

## 2020-02-10 DIAGNOSIS — I712 Thoracic aortic aneurysm, without rupture, unspecified: Secondary | ICD-10-CM

## 2020-02-10 MED ORDER — IOHEXOL 350 MG/ML SOLN
100.0000 mL | Freq: Once | INTRAVENOUS | Status: AC | PRN
  Administered 2020-02-10: 11:00:00 100 mL via INTRAVENOUS

## 2020-02-19 ENCOUNTER — Other Ambulatory Visit: Payer: Self-pay | Admitting: Pharmacist

## 2020-02-19 MED ORDER — WARFARIN SODIUM 5 MG PO TABS: 5.0000 mg | ORAL_TABLET | Freq: Every day | ORAL | 2 refills | Status: DC

## 2020-03-03 ENCOUNTER — Ambulatory Visit (INDEPENDENT_AMBULATORY_CARE_PROVIDER_SITE_OTHER): Payer: Medicare HMO | Admitting: Pharmacist

## 2020-03-03 ENCOUNTER — Other Ambulatory Visit: Payer: Self-pay

## 2020-03-03 DIAGNOSIS — Z7901 Long term (current) use of anticoagulants: Secondary | ICD-10-CM

## 2020-03-03 DIAGNOSIS — I4892 Unspecified atrial flutter: Secondary | ICD-10-CM

## 2020-03-03 DIAGNOSIS — I4891 Unspecified atrial fibrillation: Secondary | ICD-10-CM

## 2020-03-03 LAB — POCT INR: INR: 2.6 (ref 2.0–3.0)

## 2020-04-14 ENCOUNTER — Other Ambulatory Visit: Payer: Self-pay

## 2020-04-14 ENCOUNTER — Ambulatory Visit (INDEPENDENT_AMBULATORY_CARE_PROVIDER_SITE_OTHER): Payer: Medicare HMO | Admitting: Pharmacist Clinician (PhC)/ Clinical Pharmacy Specialist

## 2020-04-14 DIAGNOSIS — I4891 Unspecified atrial fibrillation: Secondary | ICD-10-CM | POA: Diagnosis not present

## 2020-04-14 DIAGNOSIS — Z7901 Long term (current) use of anticoagulants: Secondary | ICD-10-CM | POA: Diagnosis not present

## 2020-04-14 DIAGNOSIS — I4892 Unspecified atrial flutter: Secondary | ICD-10-CM

## 2020-04-14 LAB — POCT INR: INR: 2.9 (ref 2.0–3.0)

## 2020-05-26 ENCOUNTER — Ambulatory Visit (INDEPENDENT_AMBULATORY_CARE_PROVIDER_SITE_OTHER): Payer: Medicare HMO

## 2020-05-26 ENCOUNTER — Other Ambulatory Visit: Payer: Self-pay

## 2020-05-26 DIAGNOSIS — Z5181 Encounter for therapeutic drug level monitoring: Secondary | ICD-10-CM | POA: Diagnosis not present

## 2020-05-26 DIAGNOSIS — I4891 Unspecified atrial fibrillation: Secondary | ICD-10-CM | POA: Diagnosis not present

## 2020-05-26 DIAGNOSIS — I4892 Unspecified atrial flutter: Secondary | ICD-10-CM

## 2020-05-26 DIAGNOSIS — Z7901 Long term (current) use of anticoagulants: Secondary | ICD-10-CM

## 2020-05-26 LAB — POCT INR: INR: 2.6 (ref 2.0–3.0)

## 2020-05-26 NOTE — Patient Instructions (Signed)
Continue with 1 tablet daily except 1.5 tablets each Sunday, Wednesday and Friday  Repeat INR in 7 weeks.  

## 2020-06-08 ENCOUNTER — Other Ambulatory Visit: Payer: Self-pay | Admitting: Pharmacist Clinician (PhC)/ Clinical Pharmacy Specialist

## 2020-06-08 MED ORDER — WARFARIN SODIUM 5 MG PO TABS: ORAL_TABLET | ORAL | 1 refills | Status: DC

## 2020-07-14 ENCOUNTER — Other Ambulatory Visit: Payer: Self-pay

## 2020-07-14 ENCOUNTER — Ambulatory Visit (INDEPENDENT_AMBULATORY_CARE_PROVIDER_SITE_OTHER): Payer: Medicare HMO

## 2020-07-14 DIAGNOSIS — I4892 Unspecified atrial flutter: Secondary | ICD-10-CM | POA: Diagnosis not present

## 2020-07-14 DIAGNOSIS — Z5181 Encounter for therapeutic drug level monitoring: Secondary | ICD-10-CM

## 2020-07-14 DIAGNOSIS — Z7901 Long term (current) use of anticoagulants: Secondary | ICD-10-CM

## 2020-07-14 DIAGNOSIS — I4891 Unspecified atrial fibrillation: Secondary | ICD-10-CM | POA: Diagnosis not present

## 2020-07-14 LAB — POCT INR: INR: 2.8 (ref 2.0–3.0)

## 2020-07-14 NOTE — Patient Instructions (Signed)
Continue with 1 tablet daily except 1.5 tablets each Sunday, Wednesday and Friday  Repeat INR in 7 weeks.  

## 2020-07-26 DIAGNOSIS — Z1389 Encounter for screening for other disorder: Secondary | ICD-10-CM | POA: Diagnosis not present

## 2020-07-26 DIAGNOSIS — I119 Hypertensive heart disease without heart failure: Secondary | ICD-10-CM | POA: Diagnosis not present

## 2020-07-26 DIAGNOSIS — Z0000 Encounter for general adult medical examination without abnormal findings: Secondary | ICD-10-CM | POA: Diagnosis not present

## 2020-07-26 DIAGNOSIS — I2510 Atherosclerotic heart disease of native coronary artery without angina pectoris: Secondary | ICD-10-CM | POA: Diagnosis not present

## 2020-07-26 DIAGNOSIS — D6869 Other thrombophilia: Secondary | ICD-10-CM | POA: Diagnosis not present

## 2020-07-26 DIAGNOSIS — R7309 Other abnormal glucose: Secondary | ICD-10-CM | POA: Diagnosis not present

## 2020-07-26 DIAGNOSIS — Z125 Encounter for screening for malignant neoplasm of prostate: Secondary | ICD-10-CM | POA: Diagnosis not present

## 2020-07-26 DIAGNOSIS — I5042 Chronic combined systolic (congestive) and diastolic (congestive) heart failure: Secondary | ICD-10-CM | POA: Diagnosis not present

## 2020-07-26 DIAGNOSIS — I4819 Other persistent atrial fibrillation: Secondary | ICD-10-CM | POA: Diagnosis not present

## 2020-07-26 DIAGNOSIS — E782 Mixed hyperlipidemia: Secondary | ICD-10-CM | POA: Diagnosis not present

## 2020-08-02 NOTE — Progress Notes (Signed)
[name] Date of Birth: 1946/03/26   History of Present Illness: [name] is seen for  followup CAD. He has a history of coronary disease and is status post CABG with a Maze procedure in 2007 by Dr. Prescott Gum. Prior to CABG EF was 35-40%. CABG included an LIMA graft to LAD, saphenous vein graft to the obtuse marginal vessel, and saphenous vein graft sequentially to the PDA and posterior lateral branches of the right coronary. He quit smoking in 2007.   He was seen in the ED on 12/22/16 for symptoms of dyspnea on exertion, dry cough, chest pain and mid scapular pain. Ecg showed NSR with PVCs, prolonged QT. No acute ischemic changes. He had some edema. BNP elevated at 489. CXR showed mild vascular congestion and effusions. CTA showed no pulmonary embolus. There was enlargement of the thoracic aorta to 4 cm. He was sent home on lasix and potassium. Follow up Echo showed EF 35-40%. Myoview showed evidence of old inferior infarct. No ischemia. EF 45%. He was started on losartan.  On follow up today he reports he is doing well. He is on Coumadin due to cost.  He denies any chest pain, dyspnea, palpitations, orthopnea, or PND.  No edema. No claudication. He has been more active doing gardening, yard work and various chores.   Current Outpatient Medications on File Prior to Visit  Medication Sig Dispense Refill  . atorvastatin (LIPITOR) 40 MG tablet Take 1 tablet (40 mg total) by mouth daily. 90 tablet 3  . carvedilol (COREG) 6.25 MG tablet Take 1 tablet (6.25 mg total) by mouth 2 (two) times daily. 180 tablet 3  . warfarin (COUMADIN) 5 MG tablet Take 1 to 1 and 1/2 tablets daily as directed by coumadin clinic. 135 tablet 1   No current facility-administered medications on file prior to visit.    No Known Allergies  Past Medical History:  Diagnosis Date  . AAA (abdominal aortic aneurysm) ([place])   . Arthritis   . CAD (coronary artery disease) of artery bypass graft   . Colon polyp 2015  .  Coronary artery disease   . Diarrhea   . Diverticulosis   . Dyslipidemia   . Hemorrhoids   . HTN (hypertension)   . Hyperlipidemia   . Hypertension   . Low back pain   . PAC (premature atrial contraction)   . Paroxysmal atrial flutter ([place])   . Problems with hearing     Past Surgical History:  Procedure Laterality Date  . CORONARY ARTERY BYPASS GRAFT  2007  . CORONARY ARTERY BYPASS GRAFT    . LUMBAR DISC ARTHROPLASTY    . MAZE  2007   MAZE PROCEDURE  . TONSILLECTOMY      Social History   Tobacco Use  Smoking Status Former Smoker  . Packs/day: 1.00  . Years: 30.00  . Pack years: 30.00  . Types: Cigarettes  . Quit date: 11/13/2005  . Years since quitting: 14.7  Smokeless Tobacco Never Used    Social History   Substance and Sexual Activity  Alcohol Use No    Family History  Problem Relation Age of Onset  . Diabetes Mother   . Heart disease Father     Review of Systems: As noted in history of present illness. All other systems are reviewed and are negative.   Physical Exam: BP 120/60   Pulse 60   Ht 5\' 7"  (1.702 m)   Wt 208 lb (94.3 kg)   SpO2 97%  BMI 32.58 kg/m  GENERAL:  Well appearing WM in NAD HEENT:  PERRL, EOMI, sclera are clear. Oropharynx is clear. NECK:  No jugular venous distention, carotid upstroke brisk and symmetric, no bruits, no thyromegaly or adenopathy LUNGS:  Clear to auscultation bilaterally CHEST:  Unremarkable HEART:  IRRR,  PMI not displaced or sustained,S1 and S2 within normal limits, no S3, no S4: no clicks, no rubs, no murmurs ABD:  Soft, nontender. BS +, no masses or bruits. No hepatomegaly, no splenomegaly EXT:  2 + pulses throughout, no edema, no cyanosis no clubbing SKIN:  Warm and dry.  No rashes NEURO:  Alert and oriented x 3. Cranial nerves II through XII intact. PSYCH:  Cognitively intact     LABORATORY DATA: Lab Results  Component Value Date   WBC 7.1 12/22/2016   HGB 13.1 12/22/2016   HCT 39.4 12/22/2016    PLT 204 12/22/2016   GLUCOSE 99 02/04/2020   CHOL 138 01/08/2017   TRIG 88 01/08/2017   HDL 46 01/08/2017   LDLDIRECT 124.1 02/07/2012   LDLCALC 74 01/08/2017   ALT 25 01/08/2017   AST 21 01/08/2017   NA 139 02/04/2020   K 4.4 02/04/2020   CL 102 02/04/2020   CREATININE 0.88 02/04/2020   BUN 18 02/04/2020   CO2 24 02/04/2020   INR 2.8 07/14/2020   Labs dated 05/24/16: cholesterol 155, triglycerides 124, HDL 48, LDL 82.  Dated 05/30/17: cholesterol 160, triglycerides 144, HDL 48, LDL 84, Hgb 14.5. Creatinine 0.8, ALt normal Dated 06/25/18: cholesterol 141, triglycerides 92, HDL 52, LDL 70. Normal chemistry and CBC.  Dated 07/15/19: A1c 5.9%. cholesterol 137, triglycerides 109, HDL 50, LDL 65. CMET and CBC normal.  Dated 07/26/20: cholesterol 136, triglycerides 80, HDL 55, LDL 74. A1c 6.1%. CBC and CMET normal.  Ecg today shows AFib rate 60. Otherwise normal. I have personally reviewed and interpreted this study.  Study Result   Echo 01/29/17: Study Conclusions  - Left ventricle: The cavity size was normal. Wall thickness was   normal. Systolic function was moderately reduced. The estimated   ejection fraction was in the range of 35% to 40%. Moderate   diffuse hypokinesis with distinct regional wall motion   abnormalities. - Mitral valve: Calcified annulus. - Left atrium: The atrium was severely dilated. - Right atrium: The atrium was mildly dilated.  Myoview 01/17/17: Study Highlights     Nuclear stress EF: 45%.  Blood pressure demonstrated a hypertensive response to exercise.  There was no ST segment deviation noted during stress.  Findings consistent with prior myocardial infarction.  This is an intermediate risk study.  The left ventricular ejection fraction is mildly decreased (45-54%).   Small area of inferior wall infarct at mid and basal level no ischemia EF 45% apical hypokinesis Frequent ventricular ectopy during stress and recovery  HTN response to exercise     CT ANGIOGRAPHY CHEST WITH CONTRAST  TECHNIQUE: Multidetector CT imaging of the chest was performed using the standard protocol during bolus administration of intravenous contrast. Multiplanar CT image reconstructions and MIPs were obtained to evaluate the vascular anatomy.  CONTRAST:  43mL ISOVUE-370 IOPAMIDOL (ISOVUE-370) INJECTION 76%  COMPARISON:  12/31/2017  FINDINGS: Cardiovascular: Maximal diameter of the ascending aorta at the sinus of Valsalva, sino-tubular junction, and ascending aorta are 4.0 cm, 3.0 cm, and 3.7 cm. This compares with a maximal diameter of the ascending aorta of 4.0 cm on the prior study. There is no evidence of aortic dissection. There is no obvious acute  intramural hematoma. Atherosclerotic calcifications of the aortic arch and great vessels are noted. Visualized great vessels are patent. Postoperative changes from CABG are noted.  Mediastinum/Nodes: No abnormal mediastinal adenopathy or pericardial effusion. The thyroid is heterogeneous without discrete focal nodule. Esophagus is within normal limits.  Lungs/Pleura: Apical blebs are noted. Subpleural nodules in the right hemithorax are stable supporting benign etiology. Scarring versus atelectasis at the medial right lung base. No pneumothorax or pleural effusion.  Upper Abdomen: No acute abnormality.  Musculoskeletal: No vertebral compression deformity. No acute rib fracture. Sternotomy has healed with sternal wires in place.  Review of the MIP images confirms the above findings.  IMPRESSION: Maximal diameter of the ascending aorta is 3.7 cm. This is nonaneurysmal. Postoperative and atherosclerotic changes are noted.  Aortic Atherosclerosis (ICD10-I70.0).   Electronically Signed   By: Marybelle Killings M.D.   On: 01/01/2019 09:48  Abdominal US 12/30/18: Summary: Abdominal Aorta: The largest aortic measurement is 2.8 cm. The largest aortic diameter remains essentially  unchanged compared to prior exam. Previous diameter measurement was 2.9 cm obtained on 05/2015.   CT ANGIOGRAPHY CHEST WITH CONTRAST  TECHNIQUE: Multidetector CT imaging of the chest was performed using the standard protocol during bolus administration of intravenous contrast. Multiplanar CT image reconstructions and MIPs were obtained to evaluate the vascular anatomy.  CONTRAST:  123mL OMNIPAQUE IOHEXOL 350 MG/ML SOLN  COMPARISON:  CT 8 01/01/2019  FINDINGS: Cardiovascular: The aortic root at the sinuses of Valsalva measures 4.0 cm and stable. Sinotubular junction measures 2.8 cm and stable. Mid ascending thoracic aorta measures 3.7 cm and stable. Again noted are coronary bypass grafts coming off the ascending thoracic aorta. Typical three-vessel arch anatomy. Great vessels are patent. Atherosclerotic calcifications at the aortic arch and near the origin of the great vessels without significant stenosis. Proximal descending thoracic aorta measures 2.6 cm and stable. Mild atherosclerotic disease involving the descending thoracic aorta without aneurysm or dissection. Distal descending thoracic aorta measures 2.3 cm and stable. Again noted are calcifications at the origin of the celiac trunk but the stenosis is less than 50%. Small amount of plaque at the origin of the SMA without significant stenosis. Normal caliber of the proximal abdominal aorta. Main and central pulmonary arteries are patent without large filling defects.  Mediastinum/Nodes: Low-density nodule involving the left thyroid gland that measures roughly 2.1 cm. No mediastinal or hilar lymphadenopathy.  Lungs/Pleura: Centrilobular and paraseptal emphysema. Trachea and mainstem bronchi are patent. Stable pleural thickening and scarring along the medial right lower lobe. Minimal scarring in the superior segment of the right lower lobe is stable. Stable tiny pleural-based nodules in the right lung. There is a  small broad-based pleural nodule in the right upper lobe on sequence 5 image 34 that measures roughly 5 mm and stable. Several tiny pleural-based nodules in left chest are stable. No significant airspace disease or consolidation in the lungs. No large pleural effusions.  Upper Abdomen: No acute abnormality in the upper abdomen.  Musculoskeletal: Median sternotomy wires. Bridging osteophytes in the thoracic spine.  Review of the MIP images confirms the above findings.  IMPRESSION: 1. Thoracic aorta is stable in size. Aortic root measures up to 4.0 cm and the ascending thoracic aorta measures 3.7 cm. Negative for an aortic dissection. 2. No acute chest abnormalities. Stable areas of scarring and stable small pleural-based nodules. 3. Aortic Atherosclerosis (ICD10-I70.0) and Emphysema (ICD10-J43.9). 4. Left thyroid nodule measuring greater than 2 cm. Recommend thyroid ultrasound (ref: J Am Coll Radiol. 2015 Feb;12(2): 143-50).  Electronically Signed   By: Markus Daft M.D.   On: 02/11/2020 17:31   Assessment / Plan: 1. Chronic combined systolic and diastolic CHF. EF 35-40% by Echo 2018. 45% by Myoview at the same time.Continue Coreg and losartan. He is asymptomatic. No evidence of volume overload.   Continue sodium restriction. Stressed importance of daily aerobic exercise.  2. CAD s/p CABG 2007. On ASA, statin, beta blocker. Myoview in March 2018 showed inferior infarct without ischemia. He is asymptomatic.   3. Atrial fibrillation. Persistent/permanent.   He is asymptomatic. Rate is well controlled. [place] Vasc score of 4. Recommend continued anticoagulation. On coumadin. INR has been therapeutic.   3. Hyperlipidemia on statin. Well controlled.   4. HTN controlled.  5. AAA. 2.9 cm by [place] in 2016 and 2.8 cm by [place] in February 2020. CTA of chest  in March 2021 showed proximal aorta maximum of 4.0 cm.   6. Mild thoracic aneurysm 4.0 cm. Recommend CT yearly.   Follow up in 6  months.

## 2020-08-06 ENCOUNTER — Ambulatory Visit: Payer: Medicare HMO | Admitting: Cardiology

## 2020-08-06 ENCOUNTER — Other Ambulatory Visit: Payer: Self-pay

## 2020-08-06 ENCOUNTER — Encounter: Payer: Self-pay | Admitting: Cardiology

## 2020-08-06 VITALS — BP 120/60 | HR 60 | Ht 67.0 in | Wt 208.0 lb

## 2020-08-06 DIAGNOSIS — I5042 Chronic combined systolic (congestive) and diastolic (congestive) heart failure: Secondary | ICD-10-CM

## 2020-08-06 DIAGNOSIS — I712 Thoracic aortic aneurysm, without rupture, unspecified: Secondary | ICD-10-CM

## 2020-08-06 DIAGNOSIS — I4819 Other persistent atrial fibrillation: Secondary | ICD-10-CM

## 2020-08-06 DIAGNOSIS — I25810 Atherosclerosis of coronary artery bypass graft(s) without angina pectoris: Secondary | ICD-10-CM

## 2020-08-06 MED ORDER — FUROSEMIDE 20 MG PO TABS: 20.0000 mg | ORAL_TABLET | Freq: Every day | ORAL | 3 refills | Status: DC

## 2020-08-06 MED ORDER — LOSARTAN POTASSIUM 50 MG PO TABS: 50.0000 mg | ORAL_TABLET | Freq: Every day | ORAL | 3 refills | Status: DC

## 2020-08-06 MED ORDER — POTASSIUM CHLORIDE CRYS ER 20 MEQ PO TBCR: 20.0000 meq | EXTENDED_RELEASE_TABLET | Freq: Every day | ORAL | 3 refills | Status: DC

## 2020-08-13 DIAGNOSIS — R69 Illness, unspecified: Secondary | ICD-10-CM | POA: Diagnosis not present

## 2020-09-01 ENCOUNTER — Other Ambulatory Visit: Payer: Self-pay

## 2020-09-01 ENCOUNTER — Ambulatory Visit (INDEPENDENT_AMBULATORY_CARE_PROVIDER_SITE_OTHER): Payer: Medicare HMO

## 2020-09-01 DIAGNOSIS — Z5181 Encounter for therapeutic drug level monitoring: Secondary | ICD-10-CM

## 2020-09-01 DIAGNOSIS — I4891 Unspecified atrial fibrillation: Secondary | ICD-10-CM | POA: Diagnosis not present

## 2020-09-01 DIAGNOSIS — Z7901 Long term (current) use of anticoagulants: Secondary | ICD-10-CM

## 2020-09-01 DIAGNOSIS — I4892 Unspecified atrial flutter: Secondary | ICD-10-CM | POA: Diagnosis not present

## 2020-09-01 LAB — POCT INR: INR: 2.5 (ref 2.0–3.0)

## 2020-09-01 NOTE — Patient Instructions (Signed)
Continue with 1 tablet daily except 1.5 tablets each Sunday, Wednesday and Friday  Repeat INR in 7 weeks.

## 2020-10-18 DIAGNOSIS — I25709 Atherosclerosis of coronary artery bypass graft(s), unspecified, with unspecified angina pectoris: Secondary | ICD-10-CM | POA: Diagnosis not present

## 2020-10-18 DIAGNOSIS — E7800 Pure hypercholesterolemia, unspecified: Secondary | ICD-10-CM | POA: Diagnosis not present

## 2020-10-18 DIAGNOSIS — I119 Hypertensive heart disease without heart failure: Secondary | ICD-10-CM | POA: Diagnosis not present

## 2020-10-18 DIAGNOSIS — I5042 Chronic combined systolic (congestive) and diastolic (congestive) heart failure: Secondary | ICD-10-CM | POA: Diagnosis not present

## 2020-10-18 DIAGNOSIS — I2510 Atherosclerotic heart disease of native coronary artery without angina pectoris: Secondary | ICD-10-CM | POA: Diagnosis not present

## 2020-10-18 DIAGNOSIS — E782 Mixed hyperlipidemia: Secondary | ICD-10-CM | POA: Diagnosis not present

## 2020-10-20 ENCOUNTER — Ambulatory Visit (INDEPENDENT_AMBULATORY_CARE_PROVIDER_SITE_OTHER): Payer: Medicare HMO

## 2020-10-20 ENCOUNTER — Other Ambulatory Visit: Payer: Self-pay

## 2020-10-20 DIAGNOSIS — Z7901 Long term (current) use of anticoagulants: Secondary | ICD-10-CM

## 2020-10-20 DIAGNOSIS — I4891 Unspecified atrial fibrillation: Secondary | ICD-10-CM | POA: Diagnosis not present

## 2020-10-20 DIAGNOSIS — Z5181 Encounter for therapeutic drug level monitoring: Secondary | ICD-10-CM | POA: Diagnosis not present

## 2020-10-20 DIAGNOSIS — I4892 Unspecified atrial flutter: Secondary | ICD-10-CM

## 2020-10-20 LAB — POCT INR: INR: 2.5 (ref 2.0–3.0)

## 2020-10-20 NOTE — Patient Instructions (Signed)
Continue with 1 tablet daily except 1.5 tablets each Sunday, Wednesday and Friday  Repeat INR in 8 weeks.

## 2020-12-15 ENCOUNTER — Ambulatory Visit (INDEPENDENT_AMBULATORY_CARE_PROVIDER_SITE_OTHER): Payer: Medicare HMO

## 2020-12-15 ENCOUNTER — Other Ambulatory Visit: Payer: Self-pay

## 2020-12-15 DIAGNOSIS — Z7901 Long term (current) use of anticoagulants: Secondary | ICD-10-CM

## 2020-12-15 DIAGNOSIS — I4891 Unspecified atrial fibrillation: Secondary | ICD-10-CM

## 2020-12-15 DIAGNOSIS — I4892 Unspecified atrial flutter: Secondary | ICD-10-CM | POA: Diagnosis not present

## 2020-12-15 DIAGNOSIS — Z5181 Encounter for therapeutic drug level monitoring: Secondary | ICD-10-CM | POA: Diagnosis not present

## 2020-12-15 LAB — POCT INR: INR: 2.1 (ref 2.0–3.0)

## 2020-12-15 NOTE — Patient Instructions (Signed)
Continue with 1 tablet daily except 1.5 tablets each Sunday, Wednesday and Friday  Repeat INR in 8 weeks.

## 2021-01-10 DIAGNOSIS — E7800 Pure hypercholesterolemia, unspecified: Secondary | ICD-10-CM | POA: Diagnosis not present

## 2021-01-10 DIAGNOSIS — I5042 Chronic combined systolic (congestive) and diastolic (congestive) heart failure: Secondary | ICD-10-CM | POA: Diagnosis not present

## 2021-01-10 DIAGNOSIS — I119 Hypertensive heart disease without heart failure: Secondary | ICD-10-CM | POA: Diagnosis not present

## 2021-01-10 DIAGNOSIS — I2510 Atherosclerotic heart disease of native coronary artery without angina pectoris: Secondary | ICD-10-CM | POA: Diagnosis not present

## 2021-01-10 DIAGNOSIS — E782 Mixed hyperlipidemia: Secondary | ICD-10-CM | POA: Diagnosis not present

## 2021-01-10 DIAGNOSIS — I25709 Atherosclerosis of coronary artery bypass graft(s), unspecified, with unspecified angina pectoris: Secondary | ICD-10-CM | POA: Diagnosis not present

## 2021-01-20 ENCOUNTER — Other Ambulatory Visit: Payer: Self-pay

## 2021-01-20 MED ORDER — WARFARIN SODIUM 5 MG PO TABS: ORAL_TABLET | ORAL | 0 refills | Status: DC

## 2021-02-02 NOTE — Progress Notes (Unsigned)
Mendel W Hennon Date of Birth: 05/05/1946   History of Present Illness: [name] is seen for  followup CAD. He has a history of coronary disease and is status post CABG with a Maze procedure in 2007 by Dr. Prescott Gum. Prior to CABG EF was 35-40%. CABG included an LIMA graft to LAD, saphenous vein graft to the obtuse marginal vessel, and saphenous vein graft sequentially to the PDA and posterior lateral branches of the right coronary. He quit smoking in 2007.   He was seen in the ED on 12/22/16 for symptoms of dyspnea on exertion, dry cough, chest pain and mid scapular pain. Ecg showed NSR with PVCs, prolonged QT. No acute ischemic changes. He had some edema. BNP elevated at 489. CXR showed mild vascular congestion and effusions. CTA showed no pulmonary embolus. There was enlargement of the thoracic aorta to 4 cm. He was sent home on lasix and potassium. Follow up Echo showed EF 35-40%. Myoview showed evidence of old inferior infarct. No ischemia. EF 45%. He was started on losartan.  On follow up today he reports he is doing well. He is on Coumadin due to cost.  He denies any chest pain, dyspnea, palpitations, orthopnea, or PND.  No edema. No claudication. He has been more active doing gardening, yard work and various chores. He has lost 7 lbs.   Current Outpatient Medications on File Prior to Visit  Medication Sig Dispense Refill  . atorvastatin (LIPITOR) 40 MG tablet Take 1 tablet (40 mg total) by mouth daily. 90 tablet 3  . carvedilol (COREG) 6.25 MG tablet Take 1 tablet (6.25 mg total) by mouth 2 (two) times daily. 180 tablet 3  . furosemide (LASIX) 20 MG tablet Take 1 tablet (20 mg total) by mouth daily. 90 tablet 3  . losartan (COZAAR) 50 MG tablet Take 1 tablet (50 mg total) by mouth daily. 90 tablet 3  . potassium chloride SA (KLOR-CON) 20 MEQ tablet Take 1 tablet (20 mEq total) by mouth daily. 90 tablet 3  . warfarin (COUMADIN) 5 MG tablet Take 1 to 1 and 1/2 tablets daily as directed by  coumadin clinic. 135 tablet 0   No current facility-administered medications on file prior to visit.    No Known Allergies  Past Medical History:  Diagnosis Date  . AAA (abdominal aortic aneurysm) ([place])   . Arthritis   . CAD (coronary artery disease) of artery bypass graft   . Colon polyp 2015  . Coronary artery disease   . Diarrhea   . Diverticulosis   . Dyslipidemia   . Hemorrhoids   . HTN (hypertension)   . Hyperlipidemia   . Hypertension   . Low back pain   . PAC (premature atrial contraction)   . Paroxysmal atrial flutter ([place])   . Problems with hearing     Past Surgical History:  Procedure Laterality Date  . CORONARY ARTERY BYPASS GRAFT  2007  . CORONARY ARTERY BYPASS GRAFT    . LUMBAR DISC ARTHROPLASTY    . MAZE  2007   MAZE PROCEDURE  . TONSILLECTOMY      Social History   Tobacco Use  Smoking Status Former Smoker  . Packs/day: 1.00  . Years: 30.00  . Pack years: 30.00  . Types: Cigarettes  . Quit date: 11/13/2005  . Years since quitting: 15.2  Smokeless Tobacco Never Used    Social History   Substance and Sexual Activity  Alcohol Use No    Family History  Problem Relation  Age of Onset  . Diabetes Mother   . Heart disease Father     Review of Systems: As noted in history of present illness. All other systems are reviewed and are negative.   Physical Exam: BP 118/60   Pulse 73   Ht 5\' 7"  (1.702 m)   Wt 201 lb 3.2 oz (91.3 kg)   SpO2 93%   BMI 31.51 kg/m  GENERAL:  Well appearing WM in NAD HEENT:  PERRL, EOMI, sclera are clear. Oropharynx is clear. NECK:  No jugular venous distention, carotid upstroke brisk and symmetric, no bruits, no thyromegaly or adenopathy LUNGS:  Clear to auscultation bilaterally CHEST:  Unremarkable HEART:  IRRR,  PMI not displaced or sustained,S1 and S2 within normal limits, no S3, no S4: no clicks, no rubs, no murmurs ABD:  Soft, nontender. BS +, no masses or bruits. No hepatomegaly, no splenomegaly EXT:  2 +  pulses throughout, no edema, no cyanosis no clubbing SKIN:  Warm and dry.  No rashes NEURO:  Alert and oriented x 3. Cranial nerves II through XII intact. PSYCH:  Cognitively intact     LABORATORY DATA: Lab Results  Component Value Date   WBC 7.1 12/22/2016   HGB 13.1 12/22/2016   HCT 39.4 12/22/2016   PLT 204 12/22/2016   GLUCOSE 99 02/04/2020   CHOL 138 01/08/2017   TRIG 88 01/08/2017   HDL 46 01/08/2017   LDLDIRECT 124.1 02/07/2012   LDLCALC 74 01/08/2017   ALT 25 01/08/2017   AST 21 01/08/2017   NA 139 02/04/2020   K 4.4 02/04/2020   CL 102 02/04/2020   CREATININE 0.88 02/04/2020   BUN 18 02/04/2020   CO2 24 02/04/2020   INR 2.1 12/15/2020   Labs dated 05/24/16: cholesterol 155, triglycerides 124, HDL 48, LDL 82.  Dated 05/30/17: cholesterol 160, triglycerides 144, HDL 48, LDL 84, Hgb 14.5. Creatinine 0.8, ALt normal Dated 06/25/18: cholesterol 141, triglycerides 92, HDL 52, LDL 70. Normal chemistry and CBC.  Dated 07/15/19: A1c 5.9%. cholesterol 137, triglycerides 109, HDL 50, LDL 65. CMET and CBC normal.  Dated 07/26/20: cholesterol 136, triglycerides 80, HDL 55, LDL 74. A1c 6.1%. CBC and CMET normal.  Ecg today shows AFib rate 60. Otherwise normal. I have personally reviewed and interpreted this study.  Study Result   Echo 01/29/17: Study Conclusions  - Left ventricle: The cavity size was normal. Wall thickness was   normal. Systolic function was moderately reduced. The estimated   ejection fraction was in the range of 35% to 40%. Moderate   diffuse hypokinesis with distinct regional wall motion   abnormalities. - Mitral valve: Calcified annulus. - Left atrium: The atrium was severely dilated. - Right atrium: The atrium was mildly dilated.  Myoview 01/17/17: Study Highlights     Nuclear stress EF: 45%.  Blood pressure demonstrated a hypertensive response to exercise.  There was no ST segment deviation noted during stress.  Findings consistent with prior  myocardial infarction.  This is an intermediate risk study.  The left ventricular ejection fraction is mildly decreased (45-54%).   Small area of inferior wall infarct at mid and basal level no ischemia EF 45% apical hypokinesis Frequent ventricular ectopy during stress and recovery  HTN response to exercise    CT ANGIOGRAPHY CHEST WITH CONTRAST  TECHNIQUE: Multidetector CT imaging of the chest was performed using the standard protocol during bolus administration of intravenous contrast. Multiplanar CT image reconstructions and MIPs were obtained to evaluate the vascular anatomy.  CONTRAST:  60mL ISOVUE-370 IOPAMIDOL (ISOVUE-370) INJECTION 76%  COMPARISON:  12/31/2017  FINDINGS: Cardiovascular: Maximal diameter of the ascending aorta at the sinus of Valsalva, sino-tubular junction, and ascending aorta are 4.0 cm, 3.0 cm, and 3.7 cm. This compares with a maximal diameter of the ascending aorta of 4.0 cm on the prior study. There is no evidence of aortic dissection. There is no obvious acute intramural hematoma. Atherosclerotic calcifications of the aortic arch and great vessels are noted. Visualized great vessels are patent. Postoperative changes from CABG are noted.  Mediastinum/Nodes: No abnormal mediastinal adenopathy or pericardial effusion. The thyroid is heterogeneous without discrete focal nodule. Esophagus is within normal limits.  Lungs/Pleura: Apical blebs are noted. Subpleural nodules in the right hemithorax are stable supporting benign etiology. Scarring versus atelectasis at the medial right lung base. No pneumothorax or pleural effusion.  Upper Abdomen: No acute abnormality.  Musculoskeletal: No vertebral compression deformity. No acute rib fracture. Sternotomy has healed with sternal wires in place.  Review of the MIP images confirms the above findings.  IMPRESSION: Maximal diameter of the ascending aorta is 3.7 cm. This is nonaneurysmal.  Postoperative and atherosclerotic changes are noted.  Aortic Atherosclerosis (ICD10-I70.0).   Electronically Signed   By: Marybelle Killings M.D.   On: 01/01/2019 09:48  Abdominal US 12/30/18: Summary: Abdominal Aorta: The largest aortic measurement is 2.8 cm. The largest aortic diameter remains essentially unchanged compared to prior exam. Previous diameter measurement was 2.9 cm obtained on 05/2015.   CT ANGIOGRAPHY CHEST WITH CONTRAST  TECHNIQUE: Multidetector CT imaging of the chest was performed using the standard protocol during bolus administration of intravenous contrast. Multiplanar CT image reconstructions and MIPs were obtained to evaluate the vascular anatomy.  CONTRAST:  120mL OMNIPAQUE IOHEXOL 350 MG/ML SOLN  COMPARISON:  CT 8 01/01/2019  FINDINGS: Cardiovascular: The aortic root at the sinuses of Valsalva measures 4.0 cm and stable. Sinotubular junction measures 2.8 cm and stable. Mid ascending thoracic aorta measures 3.7 cm and stable. Again noted are coronary bypass grafts coming off the ascending thoracic aorta. Typical three-vessel arch anatomy. Great vessels are patent. Atherosclerotic calcifications at the aortic arch and near the origin of the great vessels without significant stenosis. Proximal descending thoracic aorta measures 2.6 cm and stable. Mild atherosclerotic disease involving the descending thoracic aorta without aneurysm or dissection. Distal descending thoracic aorta measures 2.3 cm and stable. Again noted are calcifications at the origin of the celiac trunk but the stenosis is less than 50%. Small amount of plaque at the origin of the SMA without significant stenosis. Normal caliber of the proximal abdominal aorta. Main and central pulmonary arteries are patent without large filling defects.  Mediastinum/Nodes: Low-density nodule involving the left thyroid gland that measures roughly 2.1 cm. No mediastinal or  hilar lymphadenopathy.  Lungs/Pleura: Centrilobular and paraseptal emphysema. Trachea and mainstem bronchi are patent. Stable pleural thickening and scarring along the medial right lower lobe. Minimal scarring in the superior segment of the right lower lobe is stable. Stable tiny pleural-based nodules in the right lung. There is a small broad-based pleural nodule in the right upper lobe on sequence 5 image 34 that measures roughly 5 mm and stable. Several tiny pleural-based nodules in left chest are stable. No significant airspace disease or consolidation in the lungs. No large pleural effusions.  Upper Abdomen: No acute abnormality in the upper abdomen.  Musculoskeletal: Median sternotomy wires. Bridging osteophytes in the thoracic spine.  Review of the MIP images confirms the above findings.  IMPRESSION: 1. Thoracic  aorta is stable in size. Aortic root measures up to 4.0 cm and the ascending thoracic aorta measures 3.7 cm. Negative for an aortic dissection. 2. No acute chest abnormalities. Stable areas of scarring and stable small pleural-based nodules. 3. Aortic Atherosclerosis (ICD10-I70.0) and Emphysema (ICD10-J43.9). 4. Left thyroid nodule measuring greater than 2 cm. Recommend thyroid ultrasound (ref: J Am Coll Radiol. 2015 Feb;12(2): 143-50).   Electronically Signed   By: Markus Daft M.D.   On: 02/11/2020 17:31   Assessment / Plan: 1. Chronic combined systolic and diastolic CHF. EF 35-40% by Echo 2018. 45% by Myoview at the same time.Continue Coreg and losartan. He is asymptomatic. No evidence of volume overload.   Continue sodium restriction. Stressed importance of daily aerobic exercise.  2. CAD s/p CABG 2007. On ASA, statin, beta blocker. Myoview in March 2018 showed inferior infarct without ischemia. He is asymptomatic.   3. Atrial fibrillation. Persistent/permanent.   He is asymptomatic. Rate is well controlled. [place] Vasc score of 4. Recommend continued  anticoagulation. On coumadin. INR has been therapeutic.   3. Hyperlipidemia on statin. Well controlled.   4. HTN controlled.  5. AAA. 2.9 cm by [place] in 2016 and 2.8 cm by [place] in February 2020. CTA of chest  in March 2021 showed proximal aorta maximum of 4.0 cm.   6. Mild thoracic aneurysm 4.0 cm. This has been stable since Feb 2018. Will plan to check with CT every other year now unless there is a change.   Follow up in 6 months.

## 2021-02-04 ENCOUNTER — Encounter: Payer: Self-pay | Admitting: Cardiology

## 2021-02-04 ENCOUNTER — Other Ambulatory Visit: Payer: Self-pay

## 2021-02-04 ENCOUNTER — Ambulatory Visit: Payer: Medicare HMO | Admitting: Cardiology

## 2021-02-04 VITALS — BP 118/60 | HR 73 | Ht 67.0 in | Wt 201.2 lb

## 2021-02-04 DIAGNOSIS — I4819 Other persistent atrial fibrillation: Secondary | ICD-10-CM | POA: Diagnosis not present

## 2021-02-04 DIAGNOSIS — E7800 Pure hypercholesterolemia, unspecified: Secondary | ICD-10-CM

## 2021-02-04 DIAGNOSIS — I712 Thoracic aortic aneurysm, without rupture, unspecified: Secondary | ICD-10-CM

## 2021-02-04 DIAGNOSIS — I25708 Atherosclerosis of coronary artery bypass graft(s), unspecified, with other forms of angina pectoris: Secondary | ICD-10-CM

## 2021-02-04 DIAGNOSIS — Z7901 Long term (current) use of anticoagulants: Secondary | ICD-10-CM | POA: Diagnosis not present

## 2021-02-09 ENCOUNTER — Ambulatory Visit (INDEPENDENT_AMBULATORY_CARE_PROVIDER_SITE_OTHER): Payer: Medicare HMO

## 2021-02-09 ENCOUNTER — Other Ambulatory Visit: Payer: Self-pay

## 2021-02-09 DIAGNOSIS — Z5181 Encounter for therapeutic drug level monitoring: Secondary | ICD-10-CM

## 2021-02-09 DIAGNOSIS — I4892 Unspecified atrial flutter: Secondary | ICD-10-CM | POA: Diagnosis not present

## 2021-02-09 DIAGNOSIS — I4891 Unspecified atrial fibrillation: Secondary | ICD-10-CM | POA: Diagnosis not present

## 2021-02-09 DIAGNOSIS — Z7901 Long term (current) use of anticoagulants: Secondary | ICD-10-CM

## 2021-02-09 LAB — POCT INR: INR: 3 (ref 2.0–3.0)

## 2021-02-09 NOTE — Patient Instructions (Signed)
Continue with 1 tablet daily except 1.5 tablets each Sunday, Wednesday and Friday  Repeat INR in 8 weeks.

## 2021-04-06 ENCOUNTER — Ambulatory Visit (INDEPENDENT_AMBULATORY_CARE_PROVIDER_SITE_OTHER): Payer: Medicare HMO

## 2021-04-06 ENCOUNTER — Other Ambulatory Visit: Payer: Self-pay

## 2021-04-06 DIAGNOSIS — Z5181 Encounter for therapeutic drug level monitoring: Secondary | ICD-10-CM

## 2021-04-06 DIAGNOSIS — I4892 Unspecified atrial flutter: Secondary | ICD-10-CM

## 2021-04-06 DIAGNOSIS — Z7901 Long term (current) use of anticoagulants: Secondary | ICD-10-CM

## 2021-04-06 DIAGNOSIS — I4891 Unspecified atrial fibrillation: Secondary | ICD-10-CM

## 2021-04-06 LAB — POCT INR: INR: 2.9 (ref 2.0–3.0)

## 2021-04-06 NOTE — Patient Instructions (Signed)
Continue with 1 tablet daily except 1.5 tablets each Sunday, Wednesday and Friday  Repeat INR in 8 weeks.

## 2021-04-28 ENCOUNTER — Telehealth: Payer: Self-pay

## 2021-04-28 ENCOUNTER — Encounter: Payer: Self-pay | Admitting: Gastroenterology

## 2021-04-28 ENCOUNTER — Ambulatory Visit: Payer: Medicare HMO | Admitting: Gastroenterology

## 2021-04-28 VITALS — BP 138/70 | HR 72 | Ht 64.5 in | Wt 205.4 lb

## 2021-04-28 DIAGNOSIS — I480 Paroxysmal atrial fibrillation: Secondary | ICD-10-CM | POA: Diagnosis not present

## 2021-04-28 DIAGNOSIS — Z7901 Long term (current) use of anticoagulants: Secondary | ICD-10-CM | POA: Diagnosis not present

## 2021-04-28 DIAGNOSIS — Z86010 Personal history of colonic polyps: Secondary | ICD-10-CM

## 2021-04-28 MED ORDER — SUTAB 1479-225-188 MG PO TABS: 1.0000 | ORAL_TABLET | Freq: Once | ORAL | 0 refills | Status: AC

## 2021-04-28 NOTE — Progress Notes (Signed)
HPI :  75 year old male here for follow-up visit to discuss surveillance colonoscopy.  He has a history of A. fib on chronic Coumadin therapy, AAA, CAD status post CABG.  His last cardiac evaluation showed an EF of 35 to 45% in 2018.  As below he had a 6 mm adenoma removed in 2015 with Dr. Michail Sermon, otherwise had some benign hyperplastic polyps of the left colon.  He is due for surveillance at this time.  He is doing well without any abdominal complaints.  Moves his bowels regularly, no blood in his stools.  Depending on what he is eating sometimes he can become constipated.  He denies any problems with his heart recently, denies any cardiopulmonary symptoms.  He is tolerating Coumadin well without any bleeding symptoms.  He denies any family history of colon cancer.   His last echocardiogram was performed in 2018 at which point his EF was 35 to 40%, by myoview it was 45%.   Records from prior colonoscopy reviewed as outlined:   Colonoscopy 04/23/2014 - Dr. Michail Sermon - 5 polyps removed in the rectum / sigmoid colon c/w hyperplastic polyps, one 76mm rectal polyp c/w adenoma. Diverticulosis and hemorrhoids noted. Prep listed as "good".   Past Medical History:  Diagnosis Date   AAA (abdominal aortic aneurysm) ([facility])    Arthritis    CAD (coronary artery disease) of artery bypass graft    Colon polyp 2015   Diarrhea    Diverticulosis    Dyslipidemia    Hemorrhoids    HTN (hypertension)    Hyperlipidemia    Hypertension    Low back pain    PAC (premature atrial contraction)    Paroxysmal atrial flutter ([facility])    Problems with hearing      Past Surgical History:  Procedure Laterality Date   CORONARY ARTERY BYPASS GRAFT  2007   CORONARY ARTERY BYPASS GRAFT     LUMBAR [place] ARTHROPLASTY     MAZE  2007   MAZE PROCEDURE   TONSILLECTOMY     Family History  Problem Relation Age of Onset   Diabetes Mother    Heart disease Father    Social History   Tobacco Use   Smoking status:  Former    Packs/day: 1.00    Years: 30.00    Pack years: 30.00    Types: Cigarettes    Quit date: 11/13/2005    Years since quitting: 15.4   Smokeless tobacco: Never  Vaping Use   Vaping Use: Never used  Substance Use Topics   Alcohol use: No   Drug use: No   Current Outpatient Medications  Medication Sig Dispense Refill   atorvastatin (LIPITOR) 40 MG tablet Take 1 tablet (40 mg total) by mouth daily. 90 tablet 3   carvedilol (COREG) 6.25 MG tablet Take 1 tablet (6.25 mg total) by mouth 2 (two) times daily. 180 tablet 3   furosemide (LASIX) 20 MG tablet Take 1 tablet (20 mg total) by mouth daily. 90 tablet 3   losartan (COZAAR) 50 MG tablet Take 1 tablet (50 mg total) by mouth daily. 90 tablet 3   potassium chloride SA (KLOR-CON) 20 MEQ tablet Take 1 tablet (20 mEq total) by mouth daily. 90 tablet 3   warfarin (COUMADIN) 5 MG tablet Take 1 to 1 and 1/2 tablets daily as directed by coumadin clinic. 135 tablet 0   No current facility-administered medications for this visit.   No Known Allergies   Review of Systems: All systems reviewed and  negative except where noted in HPI.   Lab Results  Component Value Date   WBC 7.1 12/22/2016   HGB 13.1 12/22/2016   HCT 39.4 12/22/2016   MCV 91.6 12/22/2016   PLT 204 12/22/2016      Physical Exam: BP 138/70 (BP Location: Left Arm, Patient Position: Sitting, Cuff Size: Normal)   Pulse 72   Ht 5' 4.5" (1.638 m) Comment: height measured without shoes  Wt 205 lb 6 oz (93.2 kg)   BMI 34.71 kg/m  Constitutional: Pleasant,well-developed, male in no acute distress. Cardiovascular: Normal rate, regular rhythm.  Pulmonary/chest: Effort normal and breath sounds normal.  Abdominal: Soft, protuberant, nontender. There are no masses palpable.  Extremities: no edema Lymphadenopathy: No cervical adenopathy noted. Neurological: Alert and oriented to person place and time. Skin: Skin is warm and dry. No rashes noted. Psychiatric: Normal mood  and affect. Behavior is normal.   ASSESSMENT AND PLAN: 75 year old male here for reassessment of the following:  History of colon polyps Anticoagulated Atrial fibrillation  Patient is due for routine surveillance colonoscopy due to an adenoma removed 7 years ago.  We discussed optical colonoscopy, risks and benefits of the procedure and anesthesia, and he wants to proceed.  He currently denies any cardiopulmonary symptoms that bother him, atrial fibrillation appears well controlled.  Due to his bleeding risk with polypectomy we are requesting that he hold his Coumadin for 5 days before his procedure and will reach out to his prescribing physician to make sure that is okay.  He is specifically requesting pill based prep, therefore will use Sutab.  Further recommendations pending results of his colonoscopy, he agreed with the plan as outlined.  Kerrick Cellar, MD [facility]

## 2021-04-28 NOTE — Telephone Encounter (Signed)
Patient with diagnosis of A Fib on warfarin for anticoagulation.    Procedure: colonoscopy Date of procedure: 07/15/21   CHA2DS2-VASc Score = 5  This indicates a 7.2% annual risk of stroke. The patient's score is based upon: CHF History: Yes HTN History: Yes Diabetes History: No Stroke History: No Vascular Disease History: Yes Age Score: 2 Gender Score: 0   CrCl 100 mL/min Platelet count unknown   Per office protocol, patient can hold warfarin for 5 days prior to procedure.    Patient will not need bridging with Lovenox (enoxaparin) around procedure.  If not bridging, patient should restart warfarin on the evening of procedure or day after, at discretion of procedure MD

## 2021-04-28 NOTE — Patient Instructions (Addendum)
If you are age 75 or older, your body mass index should be between 23-30. Your Body mass index is 34.71 kg/m. If this is out of the aforementioned range listed, please consider follow up with your Primary Care Provider.  _________________________________________________________  The [facility] providers would like to encourage you to use [facility] to communicate with providers for non-urgent requests or questions.  Due to long hold times on the telephone, sending your provider a message by [facility] may be a faster and more efficient way to get a response.  Please allow 48 business hours for a response.  Please remember that this is for non-urgent requests.  __________________________________________________________  [name] have been scheduled for a colonoscopy. Please follow written instructions given to you at your visit today.  Please pick up your prep supplies at the pharmacy within the next 1-3 days. If you use inhalers (even only as needed), please bring them with you on the day of your procedure.    You will be contacted by our office prior to your procedure for directions on holding your Coumadin.  If you do not hear from our office 1 week prior to your scheduled procedure, please call [phone] to discuss.   Thank you for entrusting me with your care and for choosing [facility], Dr. Avon Cellar

## 2021-04-28 NOTE — Telephone Encounter (Signed)
[facility] Pre-operative Risk Assessment     Request for surgical clearance:     Endoscopy Procedure  What type of surgery is being performed?     COLONOSCOPY  When is this surgery scheduled?     07-15-21  What type of clearance is required ?   Pharmacy  Are there any medications that need to be held prior to surgery and how long? COUMADIN 5 DAYS  Practice name and name of physician performing surgery?   [facility]  What is your office phone and fax number?      Phone- [phone]  Fax- [phone] ATTN: Lemar Lofty  Anesthesia type (None, local, MAC, general) ?       MAC   Thank you!

## 2021-04-28 NOTE — Telephone Encounter (Signed)
Patient called and we discussed that he will need to hold his warfarin for 5 days starting on 8-28.  He indicated that he wrote it on his calendar.

## 2021-04-28 NOTE — Telephone Encounter (Signed)
Called and left detailed message for patient to call back to discuss clearance to hold warfarin for 5 days prior to procedure in September.  He will need to hold starting on Sunday,  August 28th.

## 2021-04-28 NOTE — Telephone Encounter (Signed)
    Hurbert Duran Senters DOB:  1946-11-01  MRN:  [id number]   Primary Cardiologist: None  Chart reviewed as part of pre-operative protocol coverage. Given past medical history and time since last visit, based on ACC/AHA guidelines, Ibrahem Volkman Glatfelter would be at acceptable risk for the planned procedure without further cardiovascular testing.   Patient with diagnosis of A Fib on warfarin for anticoagulation.     Procedure: colonoscopy Date of procedure: 07/15/21   CHA2DS2-VASc Score = 5  This indicates a 7.2% annual risk of stroke. The patient's score is based upon: CHF History: Yes HTN History: Yes Diabetes History: No Stroke History: No Vascular Disease History: Yes Age Score: 2 Gender Score: 0    CrCl 100 mL/min Platelet count unknown   Per office protocol, patient can hold warfarin for 5 days prior to procedure.     Patient will not need bridging with Lovenox (enoxaparin) around procedure.   If not bridging, patient should restart warfarin on the evening of procedure or day after, at discretion of procedure MD  I will route this recommendation to the requesting party via [place] fax function and remove from pre-op pool.  Please call with questions.  Kathyrn Drown, NP 04/28/2021, 1:19 PM

## 2021-05-13 ENCOUNTER — Other Ambulatory Visit: Payer: Self-pay | Admitting: Pharmacist

## 2021-05-13 MED ORDER — WARFARIN SODIUM 5 MG PO TABS: ORAL_TABLET | ORAL | 1 refills | Status: DC

## 2021-06-06 ENCOUNTER — Other Ambulatory Visit: Payer: Self-pay

## 2021-06-06 ENCOUNTER — Ambulatory Visit (INDEPENDENT_AMBULATORY_CARE_PROVIDER_SITE_OTHER): Payer: Medicare HMO

## 2021-06-06 DIAGNOSIS — I4892 Unspecified atrial flutter: Secondary | ICD-10-CM

## 2021-06-06 DIAGNOSIS — I4891 Unspecified atrial fibrillation: Secondary | ICD-10-CM | POA: Diagnosis not present

## 2021-06-06 DIAGNOSIS — Z5181 Encounter for therapeutic drug level monitoring: Secondary | ICD-10-CM

## 2021-06-06 DIAGNOSIS — Z7901 Long term (current) use of anticoagulants: Secondary | ICD-10-CM

## 2021-06-06 LAB — POCT INR: INR: 2.8 (ref 2.0–3.0)

## 2021-06-06 NOTE — Patient Instructions (Signed)
Continue with 1 tablet daily except 1.5 tablets each Sunday, Wednesday and Friday  Repeat INR in 6 weeks. Colonoscopy 9/2;  Will hold Coumadin 8/28-9/1

## 2021-07-15 ENCOUNTER — Ambulatory Visit (AMBULATORY_SURGERY_CENTER): Payer: Medicare HMO | Admitting: Gastroenterology

## 2021-07-15 ENCOUNTER — Encounter: Payer: Self-pay | Admitting: Gastroenterology

## 2021-07-15 ENCOUNTER — Other Ambulatory Visit: Payer: Self-pay

## 2021-07-15 VITALS — BP 98/59 | HR 62 | Temp 98.2°F | Resp 13 | Ht 64.5 in | Wt 205.0 lb

## 2021-07-15 DIAGNOSIS — Z86010 Personal history of colonic polyps: Secondary | ICD-10-CM

## 2021-07-15 DIAGNOSIS — D120 Benign neoplasm of cecum: Secondary | ICD-10-CM | POA: Diagnosis not present

## 2021-07-15 DIAGNOSIS — K5140 Inflammatory polyps of colon without complications: Secondary | ICD-10-CM

## 2021-07-15 DIAGNOSIS — D127 Benign neoplasm of rectosigmoid junction: Secondary | ICD-10-CM | POA: Diagnosis not present

## 2021-07-15 DIAGNOSIS — D125 Benign neoplasm of sigmoid colon: Secondary | ICD-10-CM

## 2021-07-15 DIAGNOSIS — D128 Benign neoplasm of rectum: Secondary | ICD-10-CM

## 2021-07-15 DIAGNOSIS — Z7901 Long term (current) use of anticoagulants: Secondary | ICD-10-CM | POA: Diagnosis not present

## 2021-07-15 DIAGNOSIS — I480 Paroxysmal atrial fibrillation: Secondary | ICD-10-CM

## 2021-07-15 MED ORDER — SODIUM CHLORIDE 0.9 % IV SOLN: 500.0000 mL | Freq: Once | INTRAVENOUS | Status: DC

## 2021-07-15 NOTE — Patient Instructions (Signed)
5 polyps removed today- await pathology  Please read over handouts about polyps, diverticulosis and hemorrhoids  Continue your normal medications RESUME COUMADIN TONIGHT!!!  YOU HAD AN ENDOSCOPIC PROCEDURE TODAY AT [facility]:   Refer to the procedure report that was given to you for any specific questions about what was found during the examination.  If the procedure report does not answer your questions, please call your gastroenterologist to clarify.  If you requested that your care partner not be given the details of your procedure findings, then the procedure report has been included in a sealed envelope for you to review at your convenience later.  YOU SHOULD EXPECT: Some feelings of bloating in the abdomen. Passage of more gas than usual.  Walking can help get rid of the air that was put into your GI tract during the procedure and reduce the bloating. If you had a lower endoscopy (such as a colonoscopy or flexible sigmoidoscopy) you may notice spotting of blood in your stool or on the toilet paper. If you underwent a bowel prep for your procedure, you may not have a normal bowel movement for a few days.  Please Note:  You might notice some irritation and congestion in your nose or some drainage.  This is from the oxygen used during your procedure.  There is no need for concern and it should clear up in a day or so.  SYMPTOMS TO REPORT IMMEDIATELY:  Following lower endoscopy (colonoscopy or flexible sigmoidoscopy):  Excessive amounts of blood in the stool  Significant tenderness or worsening of abdominal pains  Swelling of the abdomen that is new, acute  Fever of 100F or higher  For urgent or emergent issues, a gastroenterologist can be reached at any hour by calling [phone]. Do not use MyChart messaging for urgent concerns.    DIET:  We do recommend a small meal at first, but then you may proceed to your regular diet.  Drink plenty of fluids but you should  avoid alcoholic beverages for 24 hours.  ACTIVITY:  You should plan to take it easy for the rest of today and you should NOT DRIVE or use heavy machinery until tomorrow (because of the sedation medicines used during the test).    FOLLOW UP: Our staff will call the number listed on your records 48-72 hours following your procedure to check on you and address any questions or concerns that you may have regarding the information given to you following your procedure. If we do not reach you, we will leave a message.  We will attempt to reach you two times.  During this call, we will ask if you have developed any symptoms of COVID 19. If you develop any symptoms (ie: fever, flu-like symptoms, shortness of breath, cough etc.) before then, please call [phone].  If you test positive for Covid 19 in the 2 weeks post procedure, please call and report this information to [place].    If any biopsies were taken you will be contacted by phone or by letter within the next 1-3 weeks.  Please call us at [phone] if you have not heard about the biopsies in 3 weeks.    SIGNATURES/CONFIDENTIALITY: You and/or your care partner have signed paperwork which will be entered into your electronic medical record.  These signatures attest to the fact that that the information above on your After Visit Summary has been reviewed and is understood.  Full responsibility of the confidentiality of this discharge information lies  with you and/or your care-partner.

## 2021-07-15 NOTE — Progress Notes (Signed)
Called to room to assist during endoscopic procedure.  Patient ID and intended procedure confirmed with present staff. Received instructions for my participation in the procedure from the performing physician.  

## 2021-07-15 NOTE — Progress Notes (Signed)
[facility] History and Physical   Primary Care Physician:  Maury Dus, MD   Reason for Procedure:   History of colon polyps  Plan:    colonoscopy     HPI: [name] is a 75 y.o. male with a history of adenoma removed in 2015. No problems with his bowels. History of AF on coumadin, held for 5 days. Denies chest pains, shortness of breath, etc. Feels well without complaints. Understands risks / benefits of colonoscopy, wishes to proceed.   Past Medical History:  Diagnosis Date   AAA (abdominal aortic aneurysm) ([place])    Arthritis    CAD (coronary artery disease) of artery bypass graft    Colon polyp 2015   Diarrhea    Diverticulosis    Dyslipidemia    Hemorrhoids    HTN (hypertension)    Hyperlipidemia    Hypertension    Low back pain    PAC (premature atrial contraction)    Paroxysmal atrial flutter ([place])    Problems with hearing     Past Surgical History:  Procedure Laterality Date   CORONARY ARTERY BYPASS GRAFT  2007   CORONARY ARTERY BYPASS GRAFT     LUMBAR [place] ARTHROPLASTY     MAZE  2007   MAZE PROCEDURE   TONSILLECTOMY      Prior to Admission medications   Medication Sig Start Date End Date Taking? Authorizing Provider  atorvastatin (LIPITOR) 40 MG tablet Take 1 tablet (40 mg total) by mouth daily. 07/25/19  Yes Martinique, Peter M, MD  carvedilol (COREG) 6.25 MG tablet Take 1 tablet (6.25 mg total) by mouth 2 (two) times daily. 07/25/19  Yes Martinique, Peter M, MD  furosemide (LASIX) 20 MG tablet Take 1 tablet (20 mg total) by mouth daily. 08/06/20 08/01/21 Yes Martinique, Peter M, MD  losartan (COZAAR) 50 MG tablet Take 1 tablet (50 mg total) by mouth daily. 08/06/20 08/01/21 Yes Martinique, Peter M, MD  potassium chloride SA (KLOR-CON) 20 MEQ tablet Take 1 tablet (20 mEq total) by mouth daily. 08/06/20 08/01/21 Yes [place], Peter M, MD  warfarin (COUMADIN) 5 MG tablet Take 1 to 1 and 1/2 tablets daily as directed by coumadin clinic. 05/13/21   Martinique, Peter  M, MD    Current Outpatient Medications  Medication Sig Dispense Refill   atorvastatin (LIPITOR) 40 MG tablet Take 1 tablet (40 mg total) by mouth daily. 90 tablet 3   carvedilol (COREG) 6.25 MG tablet Take 1 tablet (6.25 mg total) by mouth 2 (two) times daily. 180 tablet 3   furosemide (LASIX) 20 MG tablet Take 1 tablet (20 mg total) by mouth daily. 90 tablet 3   losartan (COZAAR) 50 MG tablet Take 1 tablet (50 mg total) by mouth daily. 90 tablet 3   potassium chloride SA (KLOR-CON) 20 MEQ tablet Take 1 tablet (20 mEq total) by mouth daily. 90 tablet 3   warfarin (COUMADIN) 5 MG tablet Take 1 to 1 and 1/2 tablets daily as directed by coumadin clinic. 135 tablet 1   Current Facility-Administered Medications  Medication Dose Route Frequency Provider Last Rate Last Admin   0.9 %  sodium chloride infusion  500 mL Intravenous Once Hriday Stai, Carlota Raspberry, MD        Allergies as of 07/15/2021   (No Known Allergies)    Family History  Problem Relation Age of Onset   Diabetes Mother    Heart disease Father     Social History   Socioeconomic History   Marital  status: Married    Spouse name: Not on file   Number of children: 3   Years of education: Not on file   Highest education level: Not on file  Occupational History   Occupation: Education administrator    Comment: unemployed  Tobacco Use   Smoking status: Former    Packs/day: 1.00    Years: 30.00    Pack years: 30.00    Types: Cigarettes    Quit date: 11/13/2005    Years since quitting: 15.6   Smokeless tobacco: Never  Vaping Use   Vaping Use: Never used  Substance and Sexual Activity   Alcohol use: No   Drug use: No   Sexual activity: Not on file  Other Topics Concern   Not on file  Social History Narrative   Not on file   Social Determinants of Health   Financial Resource Strain: Not on file  Food Insecurity: Not on file  Transportation Needs: Not on file  Physical Activity: Not on file  Stress: Not on file  Social  Connections: Not on file  Intimate Partner Violence: Not on file    Review of Systems: All other review of systems negative except as mentioned in the HPI.  Physical Exam: Vital signs BP (!) 145/76   Pulse 69   Temp 98.2 F (36.8 C) (Skin)   Ht 5' 4.5" (1.638 m)   Wt 205 lb (93 kg)   SpO2 96%   BMI 34.64 kg/m   General:   Alert,  Well-developed, well-nourished, pleasant and cooperative in NAD Lungs:  Clear throughout to auscultation.   Heart:  Regular rate and rhythm; Abdomen:  Soft, nontender and nondistended.  Neuro/Psych:  Alert and cooperative. Normal mood and affect. A and O x 3  Jolly Mango, MD [facility]

## 2021-07-15 NOTE — Progress Notes (Signed)
1050- pt's abdomen still distended.  He has been able to pass air while in the RR.  Pain a "4" now.  Up to bathroom to sit on toilet to pass air  1057- pain is passing air, rating pain as  "3" now.    1105- back to bed now, awaiting Dr. Havery Moros to see pt. Pt on hands and knees- passed large amount of air and abdomen soft now.    1113- rates abdominal pain as a "1-2" but "much better."  Encouraged to ambulate, drink warm fluids when he gets home if he feels cramping in his abdomen

## 2021-07-15 NOTE — Progress Notes (Signed)
Pt's states no medical or surgical changes since previsit or office visit. VS assessed by C.W 

## 2021-07-15 NOTE — Progress Notes (Signed)
To PACU, VSS. Report to Rn.tb 

## 2021-07-15 NOTE — Op Note (Signed)
[place] Patient Name: [name] Procedure Date: 07/15/2021 9:51 AM MRN: [id number] Endoscopist: Remo Lipps P. Havery Moros , MD Age: 76 Referring MD:  Date of Birth: 1946/09/19 Gender: Male Account #: [id number] Procedure:                Colonoscopy Indications:              High risk colon cancer surveillance: Personal                            history of colonic polyps (adenoma 2015) Medicines:                Monitored Anesthesia Care Procedure:                Pre-Anesthesia Assessment:                           - Prior to the procedure, a History and Physical                            was performed, and patient medications and                            allergies were reviewed. The patient's tolerance of                            previous anesthesia was also reviewed. The risks                            and benefits of the procedure and the sedation                            options and risks were discussed with the patient.                            All questions were answered, and informed consent                            was obtained. Prior Anticoagulants: The patient has                            taken Coumadin (warfarin), last dose was 5 days                            prior to procedure. ASA Grade Assessment: III - A                            patient with severe systemic disease. After                            reviewing the risks and benefits, the patient was                            deemed in satisfactory condition to undergo the  procedure.                           After obtaining informed consent, the colonoscope                            was passed under direct vision. Throughout the                            procedure, the patient's blood pressure, pulse, and                            oxygen saturations were monitored continuously. The                            CF [id number] was introduced through the anus                             and advanced to the the cecum, identified by                            appendiceal orifice and ileocecal valve. The                            colonoscopy was performed without difficulty. The                            patient tolerated the procedure well. The quality                            of the bowel preparation was good. The ileocecal                            valve, appendiceal orifice, and rectum were                            photographed. Scope In: 10:06:28 AM Scope Out: 10:25:45 AM Scope Withdrawal Time: 0 hours 17 minutes 2 seconds  Total Procedure Duration: 0 hours 19 minutes 17 seconds  Findings:                 The perianal and digital rectal examinations were                            normal.                           Three sessile polyps were found in the cecum. The                            polyps were 3 mm in size. These polyps were removed                            with a cold snare. Resection and retrieval were  complete.                           A 3 mm polyp was found in the sigmoid colon. The                            polyp was sessile. The polyp was removed with a                            cold snare. Resection and retrieval were complete.                           A 5 to 6 mm polyp was found in the rectum. The                            polyp was pedunculated. The polyp was removed with                            a hot snare. Resection and retrieval were complete.                           Multiple medium-mouthed diverticula were found in                            the transverse colon and left colon.                           Internal hemorrhoids were found during retroflexion.                           The exam was otherwise without abnormality. Complications:            No immediate complications. Estimated blood loss:                            Minimal. Estimated Blood Loss:     Estimated blood  loss was minimal. Impression:               - Three 3 mm polyps in the cecum, removed with a                            cold snare. Resected and retrieved.                           - One 3 mm polyp in the sigmoid colon, removed with                            a cold snare. Resected and retrieved.                           - One 5 to 6 mm polyp in the rectum, removed with a                            hot snare.  Resected and retrieved.                           - Diverticulosis in the transverse colon and in the                            left colon.                           - Internal hemorrhoids.                           - The examination was otherwise normal. Recommendation:           - Patient has a contact number available for                            emergencies. The signs and symptoms of potential                            delayed complications were discussed with the                            patient. Return to normal activities tomorrow.                            Written discharge instructions were provided to the                            patient.                           - Resume previous diet.                           - Continue present medications.                           - Resume Coumadin tonight                           - Await pathology results. Remo Lipps P. Modelle Vollmer, MD 07/15/2021 10:31:04 AM This report has been signed electronically.

## 2021-07-19 ENCOUNTER — Telehealth: Payer: Self-pay

## 2021-07-19 ENCOUNTER — Telehealth: Payer: Self-pay | Admitting: *Deleted

## 2021-07-19 NOTE — Telephone Encounter (Signed)
  Follow up Call-  Call back number 07/15/2021  Post procedure Call Back phone  # [phone]  Permission to leave phone message Yes  Some recent data might be hidden     Patient questions:  Do you have a fever, pain , or abdominal swelling? No. Pain Score  0 *  Have you tolerated food without any problems? Yes.    Have you been able to return to your normal activities? Yes.    Do you have any questions about your discharge instructions: Diet   No. Medications  No. Follow up visit  No.  Do you have questions or concerns about your Care? No.  Actions: * If pain score is 4 or above: No action needed, pain <4.  Have you developed a fever since your procedure? no  2.   Have you had an respiratory symptoms (SOB or cough) since your procedure? no  3.   Have you tested positive for COVID 19 since your procedure no  4.   Have you had any family members/close contacts diagnosed with the COVID 19 since your procedure?  no   If yes to any of these questions please route to Joylene John, RN and Joella Prince, RN

## 2021-07-19 NOTE — Telephone Encounter (Signed)
Left message on follow up call. 

## 2021-07-22 ENCOUNTER — Other Ambulatory Visit: Payer: Self-pay

## 2021-07-22 ENCOUNTER — Ambulatory Visit: Payer: Medicare HMO | Admitting: *Deleted

## 2021-07-22 DIAGNOSIS — I4891 Unspecified atrial fibrillation: Secondary | ICD-10-CM | POA: Diagnosis not present

## 2021-07-22 DIAGNOSIS — Z5181 Encounter for therapeutic drug level monitoring: Secondary | ICD-10-CM | POA: Diagnosis not present

## 2021-07-22 LAB — POCT INR: INR: 1.9 — AB (ref 2.0–3.0)

## 2021-07-22 NOTE — Patient Instructions (Signed)
Description   Take 2 tablets of warfarin today and then continue with 1 tablet daily except 1.5 tablets each Sunday, Wednesday and Friday  Repeat INR in 2 weeks. [facility] [phone]

## 2021-08-05 ENCOUNTER — Ambulatory Visit: Payer: Medicare HMO

## 2021-08-05 ENCOUNTER — Other Ambulatory Visit: Payer: Self-pay

## 2021-08-05 DIAGNOSIS — I4891 Unspecified atrial fibrillation: Secondary | ICD-10-CM

## 2021-08-05 DIAGNOSIS — I4892 Unspecified atrial flutter: Secondary | ICD-10-CM | POA: Diagnosis not present

## 2021-08-05 DIAGNOSIS — Z5181 Encounter for therapeutic drug level monitoring: Secondary | ICD-10-CM

## 2021-08-05 DIAGNOSIS — Z7901 Long term (current) use of anticoagulants: Secondary | ICD-10-CM

## 2021-08-05 LAB — POCT INR: INR: 5 — AB (ref 2.0–3.0)

## 2021-08-05 NOTE — Patient Instructions (Signed)
Hold tonight and tomorrow  and then continue with 1 tablet daily except 1.5 tablets each Sunday, Wednesday and Friday  Repeat INR in 1 week. [facility] [phone]

## 2021-08-08 DIAGNOSIS — Z1389 Encounter for screening for other disorder: Secondary | ICD-10-CM | POA: Diagnosis not present

## 2021-08-08 DIAGNOSIS — Z0000 Encounter for general adult medical examination without abnormal findings: Secondary | ICD-10-CM | POA: Diagnosis not present

## 2021-08-08 DIAGNOSIS — R7303 Prediabetes: Secondary | ICD-10-CM | POA: Diagnosis not present

## 2021-08-08 DIAGNOSIS — D6869 Other thrombophilia: Secondary | ICD-10-CM | POA: Diagnosis not present

## 2021-08-08 DIAGNOSIS — I2510 Atherosclerotic heart disease of native coronary artery without angina pectoris: Secondary | ICD-10-CM | POA: Diagnosis not present

## 2021-08-08 DIAGNOSIS — I4819 Other persistent atrial fibrillation: Secondary | ICD-10-CM | POA: Diagnosis not present

## 2021-08-08 DIAGNOSIS — Z125 Encounter for screening for malignant neoplasm of prostate: Secondary | ICD-10-CM | POA: Diagnosis not present

## 2021-08-08 DIAGNOSIS — I5042 Chronic combined systolic (congestive) and diastolic (congestive) heart failure: Secondary | ICD-10-CM | POA: Diagnosis not present

## 2021-08-08 DIAGNOSIS — Z23 Encounter for immunization: Secondary | ICD-10-CM | POA: Diagnosis not present

## 2021-08-08 DIAGNOSIS — E782 Mixed hyperlipidemia: Secondary | ICD-10-CM | POA: Diagnosis not present

## 2021-08-08 DIAGNOSIS — I119 Hypertensive heart disease without heart failure: Secondary | ICD-10-CM | POA: Diagnosis not present

## 2021-08-10 ENCOUNTER — Other Ambulatory Visit: Payer: Self-pay

## 2021-08-10 ENCOUNTER — Telehealth: Payer: Self-pay

## 2021-08-10 ENCOUNTER — Ambulatory Visit (INDEPENDENT_AMBULATORY_CARE_PROVIDER_SITE_OTHER): Payer: Medicare HMO

## 2021-08-10 DIAGNOSIS — I4891 Unspecified atrial fibrillation: Secondary | ICD-10-CM

## 2021-08-10 DIAGNOSIS — I4892 Unspecified atrial flutter: Secondary | ICD-10-CM

## 2021-08-10 DIAGNOSIS — Z7901 Long term (current) use of anticoagulants: Secondary | ICD-10-CM

## 2021-08-10 DIAGNOSIS — Z5181 Encounter for therapeutic drug level monitoring: Secondary | ICD-10-CM | POA: Diagnosis not present

## 2021-08-10 LAB — POCT INR: INR: 1.8 — AB (ref 2.0–3.0)

## 2021-08-10 MED ORDER — POTASSIUM CHLORIDE CRYS ER 20 MEQ PO TBCR: 20.0000 meq | EXTENDED_RELEASE_TABLET | Freq: Every day | ORAL | 3 refills | Status: DC

## 2021-08-10 MED ORDER — ATORVASTATIN CALCIUM 40 MG PO TABS: 40.0000 mg | ORAL_TABLET | Freq: Every day | ORAL | 3 refills | Status: DC

## 2021-08-10 MED ORDER — LOSARTAN POTASSIUM 50 MG PO TABS: 50.0000 mg | ORAL_TABLET | Freq: Every day | ORAL | 3 refills | Status: DC

## 2021-08-10 MED ORDER — FUROSEMIDE 20 MG PO TABS: 20.0000 mg | ORAL_TABLET | Freq: Every day | ORAL | 3 refills | Status: DC

## 2021-08-10 NOTE — Patient Instructions (Signed)
Take 2 tablets tonight only and then continue with 1 tablet daily except 1.5 tablets each Sunday, Wednesday and Friday  Repeat INR in 4 weeks. [facility] [phone]

## 2021-08-10 NOTE — Telephone Encounter (Signed)
Spoke to patient he stated he needs 90 day refills for furosemide,potassium,atorvastatin,losartan sent to [facility].Refills sent.

## 2021-09-07 ENCOUNTER — Ambulatory Visit: Payer: Medicare HMO

## 2021-09-07 ENCOUNTER — Other Ambulatory Visit: Payer: Self-pay

## 2021-09-07 DIAGNOSIS — I4891 Unspecified atrial fibrillation: Secondary | ICD-10-CM

## 2021-09-07 DIAGNOSIS — Z5181 Encounter for therapeutic drug level monitoring: Secondary | ICD-10-CM

## 2021-09-07 DIAGNOSIS — I4892 Unspecified atrial flutter: Secondary | ICD-10-CM | POA: Diagnosis not present

## 2021-09-07 DIAGNOSIS — Z7901 Long term (current) use of anticoagulants: Secondary | ICD-10-CM

## 2021-09-07 LAB — POCT INR: INR: 3.8 — AB (ref 2.0–3.0)

## 2021-09-07 NOTE — Patient Instructions (Signed)
Hold tonight only and then continue with 1 tablet daily except 1.5 tablets each Sunday, Wednesday and Friday  Repeat INR in 3 weeks. [facility] [phone]

## 2021-09-27 ENCOUNTER — Ambulatory Visit: Payer: Medicare HMO | Admitting: *Deleted

## 2021-09-27 ENCOUNTER — Other Ambulatory Visit: Payer: Self-pay

## 2021-09-27 DIAGNOSIS — I4891 Unspecified atrial fibrillation: Secondary | ICD-10-CM | POA: Diagnosis not present

## 2021-09-27 DIAGNOSIS — Z5181 Encounter for therapeutic drug level monitoring: Secondary | ICD-10-CM

## 2021-09-27 LAB — POCT INR: INR: 2.2 (ref 2.0–3.0)

## 2021-09-27 NOTE — Patient Instructions (Signed)
Description   Continue taking 1 tablet daily except 1.5 tablets each Sunday, Wednesday and Friday  Repeat INR in 4 weeks. [facility] [phone]

## 2021-10-25 ENCOUNTER — Ambulatory Visit: Payer: Medicare HMO | Admitting: *Deleted

## 2021-10-25 ENCOUNTER — Other Ambulatory Visit: Payer: Self-pay

## 2021-10-25 DIAGNOSIS — Z5181 Encounter for therapeutic drug level monitoring: Secondary | ICD-10-CM

## 2021-10-25 DIAGNOSIS — I4891 Unspecified atrial fibrillation: Secondary | ICD-10-CM

## 2021-10-25 LAB — POCT INR: INR: 4 — AB (ref 2.0–3.0)

## 2021-10-25 NOTE — Patient Instructions (Addendum)
Description   Hold warfarin today, then continue taking 1 tablet daily except 1.5 tablets each Sunday, Wednesday and Friday. Eat an extra serving of greens today.   Repeat INR in 1 week ( seeing sooner because pt is seeing Dr 12/19) [facility] [phone]

## 2021-10-27 NOTE — Progress Notes (Signed)
[name] Date of Birth: June 16, 1946   History of Present Illness: [name] is seen for  followup CAD. He has a history of coronary disease and is status post CABG with a Maze procedure in 2007 by Dr. Prescott Gum. Prior to CABG EF was 35-40%. CABG included an LIMA graft to LAD, saphenous vein graft to the obtuse marginal vessel, and saphenous vein graft sequentially to the PDA and posterior lateral branches of the right coronary. He quit smoking in 2007.   He was seen in the ED on 12/22/16 for symptoms of dyspnea on exertion, dry cough, chest pain and mid scapular pain. Ecg showed NSR with PVCs, prolonged QT. No acute ischemic changes. He had some edema. BNP elevated at 489. CXR showed mild vascular congestion and effusions. CTA showed no pulmonary embolus. There was enlargement of the thoracic aorta to 4 cm. He was sent home on lasix and potassium. Follow up Echo showed EF 35-40%. Myoview showed evidence of old inferior infarct. No ischemia. EF 45%. He was started on losartan.  On follow up today he reports he is doing well. He is on Coumadin due to cost.  He denies any chest pain, dyspnea, palpitations, orthopnea, or PND.  No edema. No claudication. He bowls twice a week and works in his yard.   Current Outpatient Medications on File Prior to Visit  Medication Sig Dispense Refill   atorvastatin (LIPITOR) 40 MG tablet Take 1 tablet (40 mg total) by mouth daily. 90 tablet 3   carvedilol (COREG) 6.25 MG tablet Take 1 tablet (6.25 mg total) by mouth 2 (two) times daily. 180 tablet 3   furosemide (LASIX) 20 MG tablet Take 1 tablet (20 mg total) by mouth daily. 90 tablet 3   losartan (COZAAR) 50 MG tablet Take 1 tablet (50 mg total) by mouth daily. 90 tablet 3   potassium chloride SA (KLOR-CON) 20 MEQ tablet Take 1 tablet (20 mEq total) by mouth daily. 90 tablet 3   warfarin (COUMADIN) 5 MG tablet Take 1 to 1 and 1/2 tablets daily as directed by coumadin clinic. 135 tablet 1   Coenzyme Q10 (CO  Q-10) 200 MG CAPS Take 200 mg by mouth See admin instructions. (Patient not taking: Reported on 10/31/2021)     No current facility-administered medications on file prior to visit.    No Known Allergies  Past Medical History:  Diagnosis Date   AAA (abdominal aortic aneurysm)    Arthritis    CAD (coronary artery disease) of artery bypass graft    Colon polyp 2015   Diarrhea    Diverticulosis    Dyslipidemia    Hemorrhoids    HTN (hypertension)    Hyperlipidemia    Hypertension    Low back pain    PAC (premature atrial contraction)    Paroxysmal atrial flutter (HCC)    Problems with hearing     Past Surgical History:  Procedure Laterality Date   CORONARY ARTERY BYPASS GRAFT  2007   CORONARY ARTERY BYPASS GRAFT     LUMBAR [place] ARTHROPLASTY     MAZE  2007   MAZE PROCEDURE   TONSILLECTOMY      Social History   Tobacco Use  Smoking Status Former   Packs/day: 1.00   Years: 30.00   Pack years: 30.00   Types: Cigarettes   Quit date: 11/13/2005   Years since quitting: 15.9  Smokeless Tobacco Never    Social History   Substance and Sexual Activity  Alcohol Use  No    Family History  Problem Relation Age of Onset   Diabetes Mother    Heart disease Father     Review of Systems: As noted in history of present illness. All other systems are reviewed and are negative.   Physical Exam: BP (!) 148/78 (BP Location: Left Arm, Patient Position: Sitting, Cuff Size: Large)    Pulse 82    Ht 5\' 7"  (1.702 m)    Wt 198 lb 6.4 oz (90 kg)    SpO2 96%    BMI 31.07 kg/m  GENERAL:  Well appearing WM in NAD HEENT:  PERRL, EOMI, sclera are clear. Oropharynx is clear. NECK:  No jugular venous distention, carotid upstroke brisk and symmetric, no bruits, no thyromegaly or adenopathy LUNGS:  Clear to auscultation bilaterally CHEST:  Unremarkable HEART:  IRRR,  PMI not displaced or sustained,S1 and S2 within normal limits, no S3, no S4: no clicks, no rubs, no murmurs ABD:  Soft,  nontender. BS +, no masses or bruits. No hepatomegaly, no splenomegaly EXT:  2 + pulses throughout, no edema, no cyanosis no clubbing SKIN:  Warm and dry.  No rashes NEURO:  Alert and oriented x 3. Cranial nerves II through XII intact. PSYCH:  Cognitively intact     LABORATORY DATA: Lab Results  Component Value Date   WBC 7.1 12/22/2016   HGB 13.1 12/22/2016   HCT 39.4 12/22/2016   PLT 204 12/22/2016   GLUCOSE 99 02/04/2020   CHOL 138 01/08/2017   TRIG 88 01/08/2017   HDL 46 01/08/2017   LDLDIRECT 124.1 02/07/2012   LDLCALC 74 01/08/2017   ALT 25 01/08/2017   AST 21 01/08/2017   NA 139 02/04/2020   K 4.4 02/04/2020   CL 102 02/04/2020   CREATININE 0.88 02/04/2020   BUN 18 02/04/2020   CO2 24 02/04/2020   INR 2.4 10/31/2021   Labs dated 05/24/16: cholesterol 155, triglycerides 124, HDL 48, LDL 82.  Dated 05/30/17: cholesterol 160, triglycerides 144, HDL 48, LDL 84, Hgb 14.5. Creatinine 0.8, ALt normal Dated 06/25/18: cholesterol 141, triglycerides 92, HDL 52, LDL 70. Normal chemistry and CBC.  Dated 07/15/19: A1c 5.9%. cholesterol 137, triglycerides 109, HDL 50, LDL 65. CMET and CBC normal.  Dated 07/26/20: cholesterol 136, triglycerides 80, HDL 55, LDL 74. A1c 6.1%. CBC and CMET normal. Dated 08/08/21: cholesterol 127, triglycerides 113, HDL 47, LDL 59. A1c 6%. CBC and CMET normal  Ecg today shows junctional rhythm with rate 82. Nonspecific TWA. Otherwise normal. I have personally reviewed and interpreted this study.  Study Result   Echo 01/29/17: Study Conclusions   - Left ventricle: The cavity size was normal. Wall thickness was   normal. Systolic function was moderately reduced. The estimated   ejection fraction was in the range of 35% to 40%. Moderate   diffuse hypokinesis with distinct regional wall motion   abnormalities. - Mitral valve: Calcified annulus. - Left atrium: The atrium was severely dilated. - Right atrium: The atrium was mildly dilated.  Myoview  01/17/17: Study Highlights     Nuclear stress EF: 45%. Blood pressure demonstrated a hypertensive response to exercise. There was no ST segment deviation noted during stress. Findings consistent with prior myocardial infarction. This is an intermediate risk study. The left ventricular ejection fraction is mildly decreased (45-54%).   Small area of inferior wall infarct at mid and basal level no ischemia EF 45% apical hypokinesis Frequent ventricular ectopy during stress and recovery  HTN response to exercise  CT ANGIOGRAPHY CHEST WITH CONTRAST   TECHNIQUE: Multidetector CT imaging of the chest was performed using the standard protocol during bolus administration of intravenous contrast. Multiplanar CT image reconstructions and MIPs were obtained to evaluate the vascular anatomy.   CONTRAST:  85mL ISOVUE-370 IOPAMIDOL (ISOVUE-370) INJECTION 76%   COMPARISON:  12/31/2017   FINDINGS: Cardiovascular: Maximal diameter of the ascending aorta at the sinus of Valsalva, sino-tubular junction, and ascending aorta are 4.0 cm, 3.0 cm, and 3.7 cm. This compares with a maximal diameter of the ascending aorta of 4.0 cm on the prior study. There is no evidence of aortic dissection. There is no obvious acute intramural hematoma. Atherosclerotic calcifications of the aortic arch and great vessels are noted. Visualized great vessels are patent. Postoperative changes from CABG are noted.   Mediastinum/Nodes: No abnormal mediastinal adenopathy or pericardial effusion. The thyroid is heterogeneous without discrete focal nodule. Esophagus is within normal limits.   Lungs/Pleura: Apical blebs are noted. Subpleural nodules in the right hemithorax are stable supporting benign etiology. Scarring versus atelectasis at the medial right lung base. No pneumothorax or pleural effusion.   Upper Abdomen: No acute abnormality.   Musculoskeletal: No vertebral compression deformity. No acute  rib fracture. Sternotomy has healed with sternal wires in place.   Review of the MIP images confirms the above findings.   IMPRESSION: Maximal diameter of the ascending aorta is 3.7 cm. This is nonaneurysmal. Postoperative and atherosclerotic changes are noted.   Aortic Atherosclerosis (ICD10-I70.0).     Electronically Signed   By: Marybelle Killings M.D.   On: 01/01/2019 09:48   Abdominal US 12/30/18: Summary: Abdominal Aorta: The largest aortic measurement is 2.8 cm. The largest aortic diameter remains essentially unchanged compared to prior exam. Previous diameter measurement was 2.9 cm obtained on 05/2015.   CT ANGIOGRAPHY CHEST WITH CONTRAST   TECHNIQUE: Multidetector CT imaging of the chest was performed using the standard protocol during bolus administration of intravenous contrast. Multiplanar CT image reconstructions and MIPs were obtained to evaluate the vascular anatomy.   CONTRAST:  158mL OMNIPAQUE IOHEXOL 350 MG/ML SOLN   COMPARISON:  CT 8 01/01/2019   FINDINGS: Cardiovascular: The aortic root at the sinuses of Valsalva measures 4.0 cm and stable. Sinotubular junction measures 2.8 cm and stable. Mid ascending thoracic aorta measures 3.7 cm and stable. Again noted are coronary bypass grafts coming off the ascending thoracic aorta. Typical three-vessel arch anatomy. Great vessels are patent. Atherosclerotic calcifications at the aortic arch and near the origin of the great vessels without significant stenosis. Proximal descending thoracic aorta measures 2.6 cm and stable. Mild atherosclerotic disease involving the descending thoracic aorta without aneurysm or dissection. Distal descending thoracic aorta measures 2.3 cm and stable. Again noted are calcifications at the origin of the celiac trunk but the stenosis is less than 50%. Small amount of plaque at the origin of the SMA without significant stenosis. Normal caliber of the proximal abdominal aorta. Main  and central pulmonary arteries are patent without large filling defects.   Mediastinum/Nodes: Low-density nodule involving the left thyroid gland that measures roughly 2.1 cm. No mediastinal or hilar lymphadenopathy.   Lungs/Pleura: Centrilobular and paraseptal emphysema. Trachea and mainstem bronchi are patent. Stable pleural thickening and scarring along the medial right lower lobe. Minimal scarring in the superior segment of the right lower lobe is stable. Stable tiny pleural-based nodules in the right lung. There is a small broad-based pleural nodule in the right upper lobe on sequence 5 image 34 that measures roughly  5 mm and stable. Several tiny pleural-based nodules in left chest are stable. No significant airspace disease or consolidation in the lungs. No large pleural effusions.   Upper Abdomen: No acute abnormality in the upper abdomen.   Musculoskeletal: Median sternotomy wires. Bridging osteophytes in the thoracic spine.   Review of the MIP images confirms the above findings.   IMPRESSION: 1. Thoracic aorta is stable in size. Aortic root measures up to 4.0 cm and the ascending thoracic aorta measures 3.7 cm. Negative for an aortic dissection. 2. No acute chest abnormalities. Stable areas of scarring and stable small pleural-based nodules. 3. Aortic Atherosclerosis (ICD10-I70.0) and Emphysema (ICD10-J43.9). 4. Left thyroid nodule measuring greater than 2 cm. Recommend thyroid ultrasound (ref: J Am Coll Radiol. 2015 Feb;12(2): 143-50).     Electronically Signed   By: Markus Daft M.D.   On: 02/11/2020 17:31    Assessment / Plan: 1. Chronic combined systolic and diastolic CHF. EF 35-40% by Echo 2018. 45% by Myoview at the same time.Continue Coreg and losartan. He is asymptomatic. No evidence of volume overload.   Continue sodium restriction. Stressed importance of daily aerobic exercise.  2. CAD s/p CABG 2007. On ASA, statin, beta blocker. Myoview in March 2018  showed inferior infarct without ischemia. He is asymptomatic.   3. Atrial fibrillation. Persistent/permanent.   He is asymptomatic. Rate is well controlled. [place] Vasc score of 4. Recommend continued anticoagulation. On coumadin. INR 2.3 today  3. Hyperlipidemia on statin. Well controlled.   4. HTN controlled.  5. AAA. 2.9 cm by [place] in 2016 and 2.8 cm by [place] in February 2020. CTA of chest  in March 2021 showed proximal aorta maximum of 4.0 cm.   6. Mild thoracic aneurysm 4.0 cm. This has been stable since Feb 2018. Will plan to check with CT every other year now unless there is a change.   Follow up in 6 months.

## 2021-10-31 ENCOUNTER — Encounter: Payer: Self-pay | Admitting: Cardiology

## 2021-10-31 ENCOUNTER — Other Ambulatory Visit: Payer: Self-pay

## 2021-10-31 ENCOUNTER — Ambulatory Visit: Payer: Medicare HMO | Admitting: Cardiology

## 2021-10-31 ENCOUNTER — Ambulatory Visit (INDEPENDENT_AMBULATORY_CARE_PROVIDER_SITE_OTHER): Payer: Medicare HMO

## 2021-10-31 VITALS — BP 148/78 | HR 82 | Ht 67.0 in | Wt 198.4 lb

## 2021-10-31 DIAGNOSIS — I4892 Unspecified atrial flutter: Secondary | ICD-10-CM

## 2021-10-31 DIAGNOSIS — I4891 Unspecified atrial fibrillation: Secondary | ICD-10-CM

## 2021-10-31 DIAGNOSIS — Z5181 Encounter for therapeutic drug level monitoring: Secondary | ICD-10-CM

## 2021-10-31 DIAGNOSIS — I714 Abdominal aortic aneurysm, without rupture, unspecified: Secondary | ICD-10-CM | POA: Diagnosis not present

## 2021-10-31 DIAGNOSIS — I2510 Atherosclerotic heart disease of native coronary artery without angina pectoris: Secondary | ICD-10-CM | POA: Diagnosis not present

## 2021-10-31 DIAGNOSIS — Z7901 Long term (current) use of anticoagulants: Secondary | ICD-10-CM

## 2021-10-31 DIAGNOSIS — I25708 Atherosclerosis of coronary artery bypass graft(s), unspecified, with other forms of angina pectoris: Secondary | ICD-10-CM

## 2021-10-31 LAB — POCT INR: INR: 2.4 (ref 2.0–3.0)

## 2021-10-31 MED ORDER — CARVEDILOL 6.25 MG PO TABS: 6.2500 mg | ORAL_TABLET | Freq: Two times a day (BID) | ORAL | 3 refills | Status: DC

## 2021-10-31 NOTE — Patient Instructions (Signed)
continue taking 1 tablet daily except 1.5 tablets each Sunday, Wednesday and Friday.    Repeat INR in 6 weeks. [facility] [phone]

## 2021-11-15 ENCOUNTER — Ambulatory Visit: Payer: Medicare HMO | Admitting: Cardiology

## 2021-12-06 ENCOUNTER — Encounter: Payer: Self-pay | Admitting: Medical

## 2021-12-06 DIAGNOSIS — R059 Cough, unspecified: Secondary | ICD-10-CM | POA: Diagnosis not present

## 2021-12-06 DIAGNOSIS — J069 Acute upper respiratory infection, unspecified: Secondary | ICD-10-CM | POA: Diagnosis not present

## 2021-12-06 DIAGNOSIS — J441 Chronic obstructive pulmonary disease with (acute) exacerbation: Secondary | ICD-10-CM | POA: Diagnosis not present

## 2021-12-08 ENCOUNTER — Ambulatory Visit
Admission: RE | Admit: 2021-12-08 | Discharge: 2021-12-08 | Disposition: A | Discharge status: Home / Self Care | Payer: Medicare HMO | Source: Ambulatory Visit | Attending: Family Medicine | Admitting: Family Medicine

## 2021-12-08 ENCOUNTER — Other Ambulatory Visit: Payer: Self-pay | Admitting: Family Medicine

## 2021-12-08 DIAGNOSIS — R053 Chronic cough: Secondary | ICD-10-CM

## 2021-12-08 DIAGNOSIS — R059 Cough, unspecified: Secondary | ICD-10-CM | POA: Diagnosis not present

## 2021-12-12 ENCOUNTER — Other Ambulatory Visit: Payer: Self-pay

## 2021-12-12 ENCOUNTER — Ambulatory Visit: Payer: Medicare HMO

## 2021-12-12 DIAGNOSIS — I4891 Unspecified atrial fibrillation: Secondary | ICD-10-CM | POA: Diagnosis not present

## 2021-12-12 DIAGNOSIS — I4892 Unspecified atrial flutter: Secondary | ICD-10-CM

## 2021-12-12 DIAGNOSIS — Z5181 Encounter for therapeutic drug level monitoring: Secondary | ICD-10-CM

## 2021-12-12 DIAGNOSIS — Z7901 Long term (current) use of anticoagulants: Secondary | ICD-10-CM

## 2021-12-12 LAB — POCT INR: INR: 2.7 (ref 2.0–3.0)

## 2021-12-12 NOTE — Patient Instructions (Signed)
continue taking 1 tablet daily except 1.5 tablets each Sunday, Wednesday and Friday.    Repeat INR in 6 weeks. [facility] [phone]

## 2021-12-13 ENCOUNTER — Other Ambulatory Visit: Payer: Self-pay

## 2021-12-13 MED ORDER — WARFARIN SODIUM 5 MG PO TABS: ORAL_TABLET | ORAL | 0 refills | Status: DC

## 2022-01-17 ENCOUNTER — Ambulatory Visit: Payer: Medicare HMO | Admitting: Medical

## 2022-01-23 ENCOUNTER — Ambulatory Visit: Payer: Medicare HMO

## 2022-01-23 ENCOUNTER — Other Ambulatory Visit: Payer: Self-pay

## 2022-01-23 DIAGNOSIS — Z7901 Long term (current) use of anticoagulants: Secondary | ICD-10-CM

## 2022-01-23 DIAGNOSIS — I4892 Unspecified atrial flutter: Secondary | ICD-10-CM | POA: Diagnosis not present

## 2022-01-23 DIAGNOSIS — Z5181 Encounter for therapeutic drug level monitoring: Secondary | ICD-10-CM | POA: Diagnosis not present

## 2022-01-23 DIAGNOSIS — I4891 Unspecified atrial fibrillation: Secondary | ICD-10-CM

## 2022-01-23 LAB — POCT INR: INR: 2.9 (ref 2.0–3.0)

## 2022-01-23 NOTE — Patient Instructions (Signed)
continue taking 1 tablet daily except 1.5 tablets each Sunday, Wednesday and Friday.    Repeat INR in 7 weeks. [facility] [phone] ?

## 2022-02-22 ENCOUNTER — Other Ambulatory Visit: Payer: Self-pay

## 2022-02-22 MED ORDER — WARFARIN SODIUM 5 MG PO TABS: ORAL_TABLET | ORAL | 0 refills | Status: DC

## 2022-03-13 ENCOUNTER — Ambulatory Visit (INDEPENDENT_AMBULATORY_CARE_PROVIDER_SITE_OTHER): Payer: Medicare HMO

## 2022-03-13 DIAGNOSIS — Z5181 Encounter for therapeutic drug level monitoring: Secondary | ICD-10-CM | POA: Diagnosis not present

## 2022-03-13 DIAGNOSIS — I4892 Unspecified atrial flutter: Secondary | ICD-10-CM | POA: Diagnosis not present

## 2022-03-13 DIAGNOSIS — Z7901 Long term (current) use of anticoagulants: Secondary | ICD-10-CM

## 2022-03-13 DIAGNOSIS — I4891 Unspecified atrial fibrillation: Secondary | ICD-10-CM

## 2022-03-13 LAB — POCT INR: INR: 2.9 (ref 2.0–3.0)

## 2022-03-13 NOTE — Patient Instructions (Signed)
continue taking 1 tablet daily except 1.5 tablets each Sunday, Wednesday and Friday.    Repeat INR in 6 weeks. [facility] [phone] ?

## 2022-04-20 NOTE — Progress Notes (Unsigned)
[name] Date of Birth: 01-20-1946   History of Present Illness: [name] is seen for  followup CAD. He has a history of coronary disease and is status post CABG with a Maze procedure in 2007 by Dr. Prescott Gum. Prior to CABG EF was 35-40%. CABG included an LIMA graft to LAD, saphenous vein graft to the obtuse marginal vessel, and saphenous vein graft sequentially to the PDA and posterior lateral branches of the right coronary. He quit smoking in 2007.   He was seen in the ED on 12/22/16 for symptoms of dyspnea on exertion, dry cough, chest pain and mid scapular pain. Ecg showed NSR with PVCs, prolonged QT. No acute ischemic changes. He had some edema. BNP elevated at 489. CXR showed mild vascular congestion and effusions. CTA showed no pulmonary embolus. There was enlargement of the thoracic aorta to 4 cm. He was sent home on lasix and potassium. Follow up Echo showed EF 35-40%. Myoview showed evidence of old inferior infarct. No ischemia. EF 45%. He was started on losartan.  On follow up today he reports he is doing well. He is on Coumadin due to cost.  He denies any chest pain, dyspnea, palpitations, orthopnea, or PND.  No edema. No claudication. He works in his yard and notes no limitation. He has noted his HR has been slow on his watch.   Current Outpatient Medications on File Prior to Visit  Medication Sig Dispense Refill   albuterol (VENTOLIN HFA) 108 (90 Base) MCG/ACT inhaler SMARTSIG:1-2 Puff(s) By Mouth Every 4 Hours PRN     atorvastatin (LIPITOR) 40 MG tablet Take 1 tablet (40 mg total) by mouth daily. 90 tablet 3   budesonide-formoterol (SYMBICORT) 160-4.5 MCG/ACT inhaler 2 puffs     carvedilol (COREG) 6.25 MG tablet Take 1 tablet (6.25 mg total) by mouth 2 (two) times daily. 180 tablet 3   Coenzyme Q10 (CO Q-10) 200 MG CAPS Take 200 mg by mouth See admin instructions.     furosemide (LASIX) 20 MG tablet Take 1 tablet (20 mg total) by mouth daily. 90 tablet 3   losartan (COZAAR)  50 MG tablet Take 1 tablet (50 mg total) by mouth daily. 90 tablet 3   potassium chloride SA (KLOR-CON) 20 MEQ tablet Take 1 tablet (20 mEq total) by mouth daily. 90 tablet 3   warfarin (COUMADIN) 5 MG tablet Take 1 to 1 and 1/2 tablets daily as directed by coumadin clinic. 80 tablet 0   No current facility-administered medications on file prior to visit.    No Known Allergies  Past Medical History:  Diagnosis Date   AAA (abdominal aortic aneurysm) (HCC)    Arthritis    CAD (coronary artery disease) of artery bypass graft    Colon polyp 2015   Diarrhea    Diverticulosis    Dyslipidemia    Hemorrhoids    HTN (hypertension)    Hyperlipidemia    Hypertension    Low back pain    PAC (premature atrial contraction)    Paroxysmal atrial flutter ([place])    Problems with hearing     Past Surgical History:  Procedure Laterality Date   CORONARY ARTERY BYPASS GRAFT  2007   CORONARY ARTERY BYPASS GRAFT     LUMBAR [place] ARTHROPLASTY     MAZE  2007   MAZE PROCEDURE   TONSILLECTOMY      Social History   Tobacco Use  Smoking Status Former   Packs/day: 1.00   Years: 30.00  Total pack years: 30.00   Types: Cigarettes   Quit date: 11/13/2005   Years since quitting: 16.4  Smokeless Tobacco Never    Social History   Substance and Sexual Activity  Alcohol Use No    Family History  Problem Relation Age of Onset   Diabetes Mother    Heart disease Father     Review of Systems: As noted in history of present illness. All other systems are reviewed and are negative.   Physical Exam: BP (!) 112/58   Pulse (!) 46   Ht '5\' 6"'$  (1.676 m)   Wt 198 lb 12.8 oz (90.2 kg)   SpO2 97%   BMI 32.09 kg/m  GENERAL:  Well appearing WM in NAD HEENT:  PERRL, EOMI, sclera are clear. Oropharynx is clear. NECK:  No jugular venous distention, carotid upstroke brisk and symmetric, no bruits, no thyromegaly or adenopathy LUNGS:  Clear to auscultation bilaterally CHEST:  Unremarkable HEART:   IRRR,  PMI not displaced or sustained,S1 and S2 within normal limits, no S3, no S4: no clicks, no rubs, no murmurs ABD:  Soft, nontender. BS +, no masses or bruits. No hepatomegaly, no splenomegaly EXT:  2 + pulses throughout, no edema, no cyanosis no clubbing SKIN:  Warm and dry.  No rashes NEURO:  Alert and oriented x 3. Cranial nerves II through XII intact. PSYCH:  Cognitively intact     LABORATORY DATA: Lab Results  Component Value Date   WBC 7.1 12/22/2016   HGB 13.1 12/22/2016   HCT 39.4 12/22/2016   PLT 204 12/22/2016   GLUCOSE 99 02/04/2020   CHOL 138 01/08/2017   TRIG 88 01/08/2017   HDL 46 01/08/2017   LDLDIRECT 124.1 02/07/2012   LDLCALC 74 01/08/2017   ALT 25 01/08/2017   AST 21 01/08/2017   NA 139 02/04/2020   K 4.4 02/04/2020   CL 102 02/04/2020   CREATININE 0.88 02/04/2020   BUN 18 02/04/2020   CO2 24 02/04/2020   INR 2.9 03/13/2022   Labs dated 05/24/16: cholesterol 155, triglycerides 124, HDL 48, LDL 82.  Dated 05/30/17: cholesterol 160, triglycerides 144, HDL 48, LDL 84, Hgb 14.5. Creatinine 0.8, ALt normal Dated 06/25/18: cholesterol 141, triglycerides 92, HDL 52, LDL 70. Normal chemistry and CBC.  Dated 07/15/19: A1c 5.9%. cholesterol 137, triglycerides 109, HDL 50, LDL 65. CMET and CBC normal.  Dated 07/26/20: cholesterol 136, triglycerides 80, HDL 55, LDL 74. A1c 6.1%. CBC and CMET normal. Dated 08/08/21: cholesterol 127, triglycerides 113, HDL 47, LDL 59. A1c 6%. CBC and CMET normal  Ecg today shows Afib with rate 46. Otherwise normal.   I have personally reviewed and interpreted this study.  Study Result   Echo 01/29/17: Study Conclusions   - Left ventricle: The cavity size was normal. Wall thickness was   normal. Systolic function was moderately reduced. The estimated   ejection fraction was in the range of 35% to 40%. Moderate   diffuse hypokinesis with distinct regional wall motion   abnormalities. - Mitral valve: Calcified annulus. - Left  atrium: The atrium was severely dilated. - Right atrium: The atrium was mildly dilated.  Myoview 01/17/17: Study Highlights     Nuclear stress EF: 45%. Blood pressure demonstrated a hypertensive response to exercise. There was no ST segment deviation noted during stress. Findings consistent with prior myocardial infarction. This is an intermediate risk study. The left ventricular ejection fraction is mildly decreased (45-54%).   Small area of inferior wall infarct at mid and  basal level no ischemia EF 45% apical hypokinesis Frequent ventricular ectopy during stress and recovery  HTN response to exercise    CT ANGIOGRAPHY CHEST WITH CONTRAST   TECHNIQUE: Multidetector CT imaging of the chest was performed using the standard protocol during bolus administration of intravenous contrast. Multiplanar CT image reconstructions and MIPs were obtained to evaluate the vascular anatomy.   CONTRAST:  21m ISOVUE-370 IOPAMIDOL (ISOVUE-370) INJECTION 76%   COMPARISON:  12/31/2017   FINDINGS: Cardiovascular: Maximal diameter of the ascending aorta at the sinus of Valsalva, sino-tubular junction, and ascending aorta are 4.0 cm, 3.0 cm, and 3.7 cm. This compares with a maximal diameter of the ascending aorta of 4.0 cm on the prior study. There is no evidence of aortic dissection. There is no obvious acute intramural hematoma. Atherosclerotic calcifications of the aortic arch and great vessels are noted. Visualized great vessels are patent. Postoperative changes from CABG are noted.   Mediastinum/Nodes: No abnormal mediastinal adenopathy or pericardial effusion. The thyroid is heterogeneous without discrete focal nodule. Esophagus is within normal limits.   Lungs/Pleura: Apical blebs are noted. Subpleural nodules in the right hemithorax are stable supporting benign etiology. Scarring versus atelectasis at the medial right lung base. No pneumothorax or pleural effusion.   Upper Abdomen:  No acute abnormality.   Musculoskeletal: No vertebral compression deformity. No acute rib fracture. Sternotomy has healed with sternal wires in place.   Review of the MIP images confirms the above findings.   IMPRESSION: Maximal diameter of the ascending aorta is 3.7 cm. This is nonaneurysmal. Postoperative and atherosclerotic changes are noted.   Aortic Atherosclerosis (ICD10-I70.0).     Electronically Signed   By: AMarybelle KillingsM.D.   On: 01/01/2019 09:48   Abdominal [place]2/17/20: Summary: Abdominal Aorta: The largest aortic measurement is 2.8 cm. The largest aortic diameter remains essentially unchanged compared to prior exam. Previous diameter measurement was 2.9 cm obtained on 05/2015.   CT ANGIOGRAPHY CHEST WITH CONTRAST   TECHNIQUE: Multidetector CT imaging of the chest was performed using the standard protocol during bolus administration of intravenous contrast. Multiplanar CT image reconstructions and MIPs were obtained to evaluate the vascular anatomy.   CONTRAST:  1045mOMNIPAQUE IOHEXOL 350 MG/ML SOLN   COMPARISON:  CT 8 01/01/2019   FINDINGS: Cardiovascular: The aortic root at the sinuses of Valsalva measures 4.0 cm and stable. Sinotubular junction measures 2.8 cm and stable. Mid ascending thoracic aorta measures 3.7 cm and stable. Again noted are coronary bypass grafts coming off the ascending thoracic aorta. Typical three-vessel arch anatomy. Great vessels are patent. Atherosclerotic calcifications at the aortic arch and near the origin of the great vessels without significant stenosis. Proximal descending thoracic aorta measures 2.6 cm and stable. Mild atherosclerotic disease involving the descending thoracic aorta without aneurysm or dissection. Distal descending thoracic aorta measures 2.3 cm and stable. Again noted are calcifications at the origin of the celiac trunk but the stenosis is less than 50%. Small amount of plaque at the origin of the SMA without  significant stenosis. Normal caliber of the proximal abdominal aorta. Main and central pulmonary arteries are patent without large filling defects.   Mediastinum/Nodes: Low-density nodule involving the left thyroid gland that measures roughly 2.1 cm. No mediastinal or hilar lymphadenopathy.   Lungs/Pleura: Centrilobular and paraseptal emphysema. Trachea and mainstem bronchi are patent. Stable pleural thickening and scarring along the medial right lower lobe. Minimal scarring in the superior segment of the right lower lobe is stable. Stable tiny pleural-based nodules in  the right lung. There is a small broad-based pleural nodule in the right upper lobe on sequence 5 image 34 that measures roughly 5 mm and stable. Several tiny pleural-based nodules in left chest are stable. No significant airspace disease or consolidation in the lungs. No large pleural effusions.   Upper Abdomen: No acute abnormality in the upper abdomen.   Musculoskeletal: Median sternotomy wires. Bridging osteophytes in the thoracic spine.   Review of the MIP images confirms the above findings.   IMPRESSION: 1. Thoracic aorta is stable in size. Aortic root measures up to 4.0 cm and the ascending thoracic aorta measures 3.7 cm. Negative for an aortic dissection. 2. No acute chest abnormalities. Stable areas of scarring and stable small pleural-based nodules. 3. Aortic Atherosclerosis (ICD10-I70.0) and Emphysema (ICD10-J43.9). 4. Left thyroid nodule measuring greater than 2 cm. Recommend thyroid ultrasound (ref: J Am Coll Radiol. 2015 Feb;12(2): 143-50).     Electronically Signed   By: Markus Daft M.D.   On: 02/11/2020 17:31    Assessment / Plan: 1. Chronic combined systolic and diastolic CHF. EF 35-40% by Echo 2018. 45% by Myoview at the same time.Continue lasix and losartan. He is asymptomatic. No evidence of volume overload.   Continue sodium restriction. Stressed importance of daily aerobic exercise. Will  reassess LV function with Echo. Ideally would like to update his CHF therapy but cost ofEntresto and SGLT2 inhibitor may be prohibitive. Consider aldactone. Will check BMET  2. CAD s/p CABG 2007. On ASA, statin. Myoview in March 2018 showed inferior infarct without ischemia. He is asymptomatic.   3. Atrial fibrillation. Persistent/permanent.  HR is too slow.  He is asymptomatic. Will discontinue Coreg due to bradycardia. [place] Vasc score of 4. Recommend continued anticoagulation. On coumadin.check  INR today  3. Hyperlipidemia on statin. Well controlled.   4. HTN controlled.  5. AAA. 2.9 cm by [place] in 2016 and 2.8 cm by [place] in February 2020. CTA of chest  in March 2021 showed proximal aorta maximum of 4.0 cm.   6. Thoracic aortic aneurysm 4.0 cm. This has been stable since Feb 2018. Plan CT chest and abdomen with contrast to follow up thoracic and abdominal aneurysms.   Follow up in 6 months.

## 2022-04-24 ENCOUNTER — Ambulatory Visit: Payer: Medicare HMO | Admitting: Cardiology

## 2022-04-24 ENCOUNTER — Other Ambulatory Visit: Payer: Self-pay

## 2022-04-24 ENCOUNTER — Encounter: Payer: Self-pay | Admitting: Cardiology

## 2022-04-24 ENCOUNTER — Ambulatory Visit (INDEPENDENT_AMBULATORY_CARE_PROVIDER_SITE_OTHER): Payer: Medicare HMO

## 2022-04-24 VITALS — BP 112/58 | HR 46 | Ht 66.0 in | Wt 198.8 lb

## 2022-04-24 DIAGNOSIS — I714 Abdominal aortic aneurysm, without rupture, unspecified: Secondary | ICD-10-CM | POA: Diagnosis not present

## 2022-04-24 DIAGNOSIS — I7121 Aneurysm of the ascending aorta, without rupture: Secondary | ICD-10-CM | POA: Diagnosis not present

## 2022-04-24 DIAGNOSIS — Z7901 Long term (current) use of anticoagulants: Secondary | ICD-10-CM

## 2022-04-24 DIAGNOSIS — I4891 Unspecified atrial fibrillation: Secondary | ICD-10-CM | POA: Diagnosis not present

## 2022-04-24 DIAGNOSIS — I5042 Chronic combined systolic (congestive) and diastolic (congestive) heart failure: Secondary | ICD-10-CM | POA: Diagnosis not present

## 2022-04-24 DIAGNOSIS — I4892 Unspecified atrial flutter: Secondary | ICD-10-CM

## 2022-04-24 DIAGNOSIS — I25708 Atherosclerosis of coronary artery bypass graft(s), unspecified, with other forms of angina pectoris: Secondary | ICD-10-CM

## 2022-04-24 DIAGNOSIS — I4819 Other persistent atrial fibrillation: Secondary | ICD-10-CM | POA: Diagnosis not present

## 2022-04-24 LAB — POCT INR: INR: 2.2 (ref 2.0–3.0)

## 2022-04-24 MED ORDER — WARFARIN SODIUM 5 MG PO TABS: ORAL_TABLET | ORAL | 0 refills | Status: DC

## 2022-04-24 NOTE — Telephone Encounter (Signed)
Prescription refill request received for warfarin Lov: 04/24/22 ([place])  Next INR check: 06/05/22 Warfarin tablet strength: '5mg'$   Appropriate dose and refill sent to requested pharmacy.

## 2022-04-24 NOTE — Patient Instructions (Signed)
Description   Continue taking 1 tablet daily except 1.5 tablets each Sunday, Wednesday and Friday.     Repeat INR in 6 weeks.  Coumadin Clinic [phone]

## 2022-04-24 NOTE — Patient Instructions (Signed)
Medication Instructions:  STOP carvedilol   *If you need a refill on your cardiac medications before your next appointment, please call your pharmacy*   Lab Work: Non-Fasting BMET prior to CT test  If you have labs (blood work) drawn today and your tests are completely normal, you will receive your results only by: [place] (if you have MyChart) OR A paper copy in the mail If you have any lab test that is abnormal or we need to change your treatment, we will call you to review the results.   Testing/Procedures: Your physician has requested that you have an echocardiogram. Echocardiography is a painless test that uses sound waves to create images of your heart. It provides your doctor with information about the size and shape of your heart and how well your heart's chambers and valves are working. This procedure takes approximately one hour. There are no restrictions for this procedure. This test is done at [facility] @ [address] [employer]  Your physician has requested that you have a CT test done of your chest/adbomen. This is done at [place].     Follow-Up: At [facility], you and your health needs are our priority.  As part of our continuing mission to provide you with exceptional heart care, we have created designated Provider Care Teams.  These Care Teams include your primary Cardiologist (physician) and Advanced Practice Providers (APPs -  Physician Assistants and Nurse Practitioners) who all work together to provide you with the care you need, when you need it.  We recommend signing up for the patient portal called "MyChart".  Sign up information is provided on this After Visit Summary.  MyChart is used to connect with patients for Virtual Visits (Telemedicine).  Patients are able to view lab/test results, encounter notes, upcoming appointments, etc.  Non-urgent messages can be sent to your provider as well.   To learn more about what you can  do with MyChart, go to [email or web address].    Your next appointment:   6 month(s)  The format for your next appointment:   In Person  Provider:   Dr. Peter [place]

## 2022-05-02 DIAGNOSIS — I7121 Aneurysm of the ascending aorta, without rupture: Secondary | ICD-10-CM | POA: Diagnosis not present

## 2022-05-02 DIAGNOSIS — I25708 Atherosclerosis of coronary artery bypass graft(s), unspecified, with other forms of angina pectoris: Secondary | ICD-10-CM | POA: Diagnosis not present

## 2022-05-02 DIAGNOSIS — I714 Abdominal aortic aneurysm, without rupture, unspecified: Secondary | ICD-10-CM | POA: Diagnosis not present

## 2022-05-02 DIAGNOSIS — I4819 Other persistent atrial fibrillation: Secondary | ICD-10-CM | POA: Diagnosis not present

## 2022-05-02 DIAGNOSIS — I5042 Chronic combined systolic (congestive) and diastolic (congestive) heart failure: Secondary | ICD-10-CM | POA: Diagnosis not present

## 2022-05-03 LAB — BASIC METABOLIC PANEL
BUN/Creatinine Ratio: 18 (ref 10–24)
BUN: 13 mg/dL (ref 8–27)
CO2: 28 mmol/L (ref 20–29)
Calcium: 9.5 mg/dL (ref 8.6–10.2)
Chloride: 101 mmol/L (ref 96–106)
Creatinine, Ser: 0.74 mg/dL — ABNORMAL LOW (ref 0.76–1.27)
Glucose: 98 mg/dL (ref 70–99)
Potassium: 4.5 mmol/L (ref 3.5–5.2)
Sodium: 143 mmol/L (ref 134–144)
eGFR: 94 mL/min/{1.73_m2} (ref >59)

## 2022-05-10 ENCOUNTER — Ambulatory Visit (HOSPITAL_COMMUNITY): Payer: Medicare HMO | Attending: Cardiology

## 2022-05-10 DIAGNOSIS — I4819 Other persistent atrial fibrillation: Secondary | ICD-10-CM | POA: Diagnosis not present

## 2022-05-10 DIAGNOSIS — I25708 Atherosclerosis of coronary artery bypass graft(s), unspecified, with other forms of angina pectoris: Secondary | ICD-10-CM | POA: Insufficient documentation

## 2022-05-10 DIAGNOSIS — I5042 Chronic combined systolic (congestive) and diastolic (congestive) heart failure: Secondary | ICD-10-CM | POA: Diagnosis not present

## 2022-05-10 DIAGNOSIS — I7121 Aneurysm of the ascending aorta, without rupture: Secondary | ICD-10-CM | POA: Diagnosis not present

## 2022-05-10 DIAGNOSIS — I714 Abdominal aortic aneurysm, without rupture, unspecified: Secondary | ICD-10-CM | POA: Diagnosis not present

## 2022-05-10 LAB — ECHOCARDIOGRAM COMPLETE
Area-P 1/2: 3.54 cm2
S' Lateral: 4.1 cm

## 2022-05-17 ENCOUNTER — Ambulatory Visit
Admission: RE | Admit: 2022-05-17 | Discharge: 2022-05-17 | Disposition: A | Discharge status: Home / Self Care | Payer: Medicare HMO | Source: Ambulatory Visit | Attending: Cardiology | Admitting: Cardiology

## 2022-05-17 DIAGNOSIS — I4819 Other persistent atrial fibrillation: Secondary | ICD-10-CM

## 2022-05-17 DIAGNOSIS — I7121 Aneurysm of the ascending aorta, without rupture: Secondary | ICD-10-CM

## 2022-05-17 DIAGNOSIS — I714 Abdominal aortic aneurysm, without rupture, unspecified: Secondary | ICD-10-CM

## 2022-05-17 DIAGNOSIS — I5042 Chronic combined systolic (congestive) and diastolic (congestive) heart failure: Secondary | ICD-10-CM

## 2022-05-17 DIAGNOSIS — I712 Thoracic aortic aneurysm, without rupture, unspecified: Secondary | ICD-10-CM | POA: Diagnosis not present

## 2022-05-17 DIAGNOSIS — I25708 Atherosclerosis of coronary artery bypass graft(s), unspecified, with other forms of angina pectoris: Secondary | ICD-10-CM

## 2022-05-17 DIAGNOSIS — J432 Centrilobular emphysema: Secondary | ICD-10-CM | POA: Diagnosis not present

## 2022-05-17 DIAGNOSIS — M47814 Spondylosis without myelopathy or radiculopathy, thoracic region: Secondary | ICD-10-CM | POA: Diagnosis not present

## 2022-05-17 MED ORDER — IOPAMIDOL (ISOVUE-370) INJECTION 76%
100.0000 mL | Freq: Once | INTRAVENOUS | Status: AC | PRN
  Administered 2022-05-17: 100 mL via INTRAVENOUS

## 2022-06-05 ENCOUNTER — Ambulatory Visit: Payer: Medicare HMO

## 2022-06-05 DIAGNOSIS — I4892 Unspecified atrial flutter: Secondary | ICD-10-CM

## 2022-06-05 DIAGNOSIS — I4891 Unspecified atrial fibrillation: Secondary | ICD-10-CM | POA: Diagnosis not present

## 2022-06-05 DIAGNOSIS — Z5181 Encounter for therapeutic drug level monitoring: Secondary | ICD-10-CM | POA: Diagnosis not present

## 2022-06-05 DIAGNOSIS — Z7901 Long term (current) use of anticoagulants: Secondary | ICD-10-CM

## 2022-06-05 LAB — POCT INR: INR: 2.6 (ref 2.0–3.0)

## 2022-06-05 NOTE — Patient Instructions (Signed)
Continue taking 1 tablet daily except 1.5 tablets each Sunday, Wednesday and Friday.     Repeat INR in 6 weeks.  Coumadin Clinic [phone]

## 2022-07-18 ENCOUNTER — Ambulatory Visit: Payer: Medicare HMO | Attending: Cardiology

## 2022-07-18 DIAGNOSIS — Z5181 Encounter for therapeutic drug level monitoring: Secondary | ICD-10-CM | POA: Diagnosis not present

## 2022-07-18 DIAGNOSIS — I4892 Unspecified atrial flutter: Secondary | ICD-10-CM

## 2022-07-18 DIAGNOSIS — I4891 Unspecified atrial fibrillation: Secondary | ICD-10-CM | POA: Diagnosis not present

## 2022-07-18 DIAGNOSIS — Z7901 Long term (current) use of anticoagulants: Secondary | ICD-10-CM

## 2022-07-18 LAB — POCT INR: INR: 3.4 — AB (ref 2.0–3.0)

## 2022-07-18 NOTE — Patient Instructions (Signed)
HOLD TONIGHT ONLY and then Continue taking 1 tablet daily except 1.5 tablets each Sunday, Wednesday and Friday.     Repeat INR in 6 weeks.  [facility]oumadin Clinic [phone]

## 2022-08-11 ENCOUNTER — Other Ambulatory Visit: Payer: Self-pay | Admitting: Cardiology

## 2022-08-11 ENCOUNTER — Telehealth: Payer: Self-pay | Admitting: Cardiology

## 2022-08-11 MED ORDER — FUROSEMIDE 20 MG PO TABS: 20.0000 mg | ORAL_TABLET | Freq: Every day | ORAL | 0 refills | Status: DC

## 2022-08-11 MED ORDER — POTASSIUM CHLORIDE CRYS ER 20 MEQ PO TBCR: 20.0000 meq | EXTENDED_RELEASE_TABLET | Freq: Every day | ORAL | 3 refills | Status: DC

## 2022-08-11 MED ORDER — LOSARTAN POTASSIUM 50 MG PO TABS: 50.0000 mg | ORAL_TABLET | Freq: Every day | ORAL | 0 refills | Status: DC

## 2022-08-11 MED ORDER — LOSARTAN POTASSIUM 50 MG PO TABS: 50.0000 mg | ORAL_TABLET | Freq: Every day | ORAL | 3 refills | Status: DC

## 2022-08-11 NOTE — Telephone Encounter (Signed)
*  STAT* If patient is at the pharmacy, call can be transferred to refill team.   1. Which medications need to be refilled? (please list name of each medication and dose if known) losartan (COZAAR) 50 MG tablet (Expired)  2. Which pharmacy/location (including street and city if local pharmacy) is medication to be sent to? [place], [address]  3. Do they need a 30 day or 90 day supply? 90   Pt has appt scheduled.

## 2022-08-18 ENCOUNTER — Other Ambulatory Visit: Payer: Self-pay

## 2022-08-18 DIAGNOSIS — I4892 Unspecified atrial flutter: Secondary | ICD-10-CM

## 2022-08-18 MED ORDER — WARFARIN SODIUM 5 MG PO TABS: ORAL_TABLET | ORAL | 0 refills | Status: DC

## 2022-08-18 NOTE — Telephone Encounter (Signed)
Prescription refill request received for warfarin Lov: 04/24/22 ([place])  Next INR check: 08/29/22 Warfarin tablet strength: '5mg'$   Appropriate dose and refill sent to requested pharmacy

## 2022-08-29 ENCOUNTER — Ambulatory Visit: Payer: Medicare HMO | Attending: Cardiology | Admitting: *Deleted

## 2022-08-29 DIAGNOSIS — Z7901 Long term (current) use of anticoagulants: Secondary | ICD-10-CM | POA: Diagnosis not present

## 2022-08-29 DIAGNOSIS — I4891 Unspecified atrial fibrillation: Secondary | ICD-10-CM

## 2022-08-29 DIAGNOSIS — I4892 Unspecified atrial flutter: Secondary | ICD-10-CM

## 2022-08-29 LAB — POCT INR: INR: 4.1 — AB (ref 2.0–3.0)

## 2022-08-29 NOTE — Patient Instructions (Signed)
Description   Do not take any warfarin tonight then start taking 1 tablet daily except 1.5 tablets each Sunday and Wednesday. Repeat INR in 3 weeks. Coumadin Clinic [phone]

## 2022-09-18 DIAGNOSIS — I712 Thoracic aortic aneurysm, without rupture, unspecified: Secondary | ICD-10-CM | POA: Diagnosis not present

## 2022-09-18 DIAGNOSIS — I77811 Abdominal aortic ectasia: Secondary | ICD-10-CM | POA: Diagnosis not present

## 2022-09-18 DIAGNOSIS — I5042 Chronic combined systolic (congestive) and diastolic (congestive) heart failure: Secondary | ICD-10-CM | POA: Diagnosis not present

## 2022-09-18 DIAGNOSIS — Z0000 Encounter for general adult medical examination without abnormal findings: Secondary | ICD-10-CM | POA: Diagnosis not present

## 2022-09-18 DIAGNOSIS — I4819 Other persistent atrial fibrillation: Secondary | ICD-10-CM | POA: Diagnosis not present

## 2022-09-18 DIAGNOSIS — Z1331 Encounter for screening for depression: Secondary | ICD-10-CM | POA: Diagnosis not present

## 2022-09-18 DIAGNOSIS — Z6832 Body mass index (BMI) 32.0-32.9, adult: Secondary | ICD-10-CM | POA: Diagnosis not present

## 2022-09-18 DIAGNOSIS — R7303 Prediabetes: Secondary | ICD-10-CM | POA: Diagnosis not present

## 2022-09-18 DIAGNOSIS — J449 Chronic obstructive pulmonary disease, unspecified: Secondary | ICD-10-CM | POA: Diagnosis not present

## 2022-09-18 DIAGNOSIS — I2510 Atherosclerotic heart disease of native coronary artery without angina pectoris: Secondary | ICD-10-CM | POA: Diagnosis not present

## 2022-09-18 DIAGNOSIS — I119 Hypertensive heart disease without heart failure: Secondary | ICD-10-CM | POA: Diagnosis not present

## 2022-09-18 DIAGNOSIS — E782 Mixed hyperlipidemia: Secondary | ICD-10-CM | POA: Diagnosis not present

## 2022-09-19 ENCOUNTER — Ambulatory Visit: Payer: Medicare HMO | Attending: Pediatric Hematology and Oncology | Admitting: *Deleted

## 2022-09-19 DIAGNOSIS — I4891 Unspecified atrial fibrillation: Secondary | ICD-10-CM

## 2022-09-19 DIAGNOSIS — Z7901 Long term (current) use of anticoagulants: Secondary | ICD-10-CM

## 2022-09-19 DIAGNOSIS — I4892 Unspecified atrial flutter: Secondary | ICD-10-CM

## 2022-09-19 LAB — POCT INR: INR: 1.5 — AB (ref 2.0–3.0)

## 2022-09-19 NOTE — Patient Instructions (Signed)
Description   Today take 2 tablets then start taking 1 tablet daily except 1.5 tablets each Sunday, Wednesday, and Fridays. Repeat INR in 3 weeks. Coumadin Clinic [phone]

## 2022-10-10 ENCOUNTER — Ambulatory Visit: Payer: Medicare HMO | Attending: Cardiology

## 2022-10-10 DIAGNOSIS — Z7901 Long term (current) use of anticoagulants: Secondary | ICD-10-CM

## 2022-10-10 DIAGNOSIS — I4892 Unspecified atrial flutter: Secondary | ICD-10-CM

## 2022-10-10 DIAGNOSIS — I4891 Unspecified atrial fibrillation: Secondary | ICD-10-CM | POA: Diagnosis not present

## 2022-10-10 LAB — POCT INR: INR: 2 (ref 2.0–3.0)

## 2022-10-10 NOTE — Patient Instructions (Signed)
CONTINUE taking 1 tablet daily except 1.5 tablets each Sunday, Wednesday, and Fridays. Repeat INR in 5 weeks. Coumadin Clinic [phone]

## 2022-10-19 NOTE — Progress Notes (Signed)
Frans W Noon Date of Birth: 1945/11/19   History of Present Illness: [name] is seen for  followup CAD. He has a history of coronary disease and is status post CABG with a Maze procedure in 2007 by Dr. Prescott Gum. Prior to CABG EF was 35-40%. CABG included an LIMA graft to LAD, saphenous vein graft to the obtuse marginal vessel, and saphenous vein graft sequentially to the PDA and posterior lateral branches of the right coronary. He quit smoking in 2007.   He was seen in the ED on 12/22/16 for symptoms of dyspnea on exertion, dry cough, chest pain and mid scapular pain. Ecg showed NSR with PVCs, prolonged QT. No acute ischemic changes. He had some edema. BNP elevated at 489. CXR showed mild vascular congestion and effusions. CTA showed no pulmonary embolus. There was enlargement of the thoracic aorta to 4 cm. He was sent home on lasix and potassium. Follow up Echo showed EF 35-40%. Myoview showed evidence of old inferior infarct. No ischemia. EF 45%. He was started on losartan.  On follow up today he reports he is doing well. He is on Coumadin due to cost.  He denies any chest pain, dyspnea, palpitations, orthopnea, or PND.  No edema. No claudication. He has been very active redoing his kitchen floor. His HR has improved since we stopped his Coreg.   We updated an Echo in June showing EF 40-45%. CT chest and Abdomen in July showed no significant aneurysm.  Current Outpatient Medications on File Prior to Visit  Medication Sig Dispense Refill   ADVAIR HFA 115-21 MCG/ACT inhaler SMARTSIG:2 Puff(s) By Mouth Twice Daily     albuterol (VENTOLIN HFA) 108 (90 Base) MCG/ACT inhaler SMARTSIG:1-2 Puff(s) By Mouth Every 4 Hours PRN     atorvastatin (LIPITOR) 40 MG tablet Take 1 tablet (40 mg total) by mouth daily. 90 tablet 3   No current facility-administered medications on file prior to visit.    No Known Allergies  Past Medical History:  Diagnosis Date   AAA (abdominal aortic aneurysm) (HCC)     Arthritis    CAD (coronary artery disease) of artery bypass graft    Colon polyp 2015   Diarrhea    Diverticulosis    Dyslipidemia    Hemorrhoids    HTN (hypertension)    Hyperlipidemia    Hypertension    Low back pain    PAC (premature atrial contraction)    Paroxysmal atrial flutter (HCC)    Problems with hearing     Past Surgical History:  Procedure Laterality Date   CORONARY ARTERY BYPASS GRAFT  2007   CORONARY ARTERY BYPASS GRAFT     LUMBAR [place] ARTHROPLASTY     MAZE  2007   MAZE PROCEDURE   TONSILLECTOMY      Social History   Tobacco Use  Smoking Status Former   Packs/day: 1.00   Years: 30.00   Total pack years: 30.00   Types: Cigarettes   Quit date: 11/13/2005   Years since quitting: 16.9  Smokeless Tobacco Never    Social History   Substance and Sexual Activity  Alcohol Use No    Family History  Problem Relation Age of Onset   Diabetes Mother    Heart disease Father     Review of Systems: As noted in history of present illness. All other systems are reviewed and are negative.   Physical Exam: BP 124/74   Pulse 99   Ht '5\' 6"'$  (1.676 m)   Wt  196 lb 3.2 oz (89 kg)   SpO2 100%   BMI 31.67 kg/m  GENERAL:  Well appearing WM in NAD HEENT:  PERRL, EOMI, sclera are clear. Oropharynx is clear. NECK:  No jugular venous distention, carotid upstroke brisk and symmetric, no bruits, no thyromegaly or adenopathy LUNGS:  Clear to auscultation bilaterally CHEST:  Unremarkable HEART:  IRRR,  PMI not displaced or sustained,S1 and S2 within normal limits, no S3, no S4: no clicks, no rubs, no murmurs ABD:  Soft, nontender. BS +, no masses or bruits. No hepatomegaly, no splenomegaly EXT:  2 + pulses throughout, no edema, no cyanosis no clubbing SKIN:  Warm and dry.  No rashes NEURO:  Alert and oriented x 3. Cranial nerves II through XII intact. PSYCH:  Cognitively intact     LABORATORY DATA: Lab Results  Component Value Date   WBC 7.1 12/22/2016   HGB  13.1 12/22/2016   HCT 39.4 12/22/2016   PLT 204 12/22/2016   GLUCOSE 98 05/02/2022   CHOL 138 01/08/2017   TRIG 88 01/08/2017   HDL 46 01/08/2017   LDLDIRECT 124.1 02/07/2012   LDLCALC 74 01/08/2017   ALT 25 01/08/2017   AST 21 01/08/2017   NA 143 05/02/2022   K 4.5 05/02/2022   CL 101 05/02/2022   CREATININE 0.74 (L) 05/02/2022   BUN 13 05/02/2022   CO2 28 05/02/2022   INR 2.0 10/10/2022   Labs dated 05/24/16: cholesterol 155, triglycerides 124, HDL 48, LDL 82.  Dated 05/30/17: cholesterol 160, triglycerides 144, HDL 48, LDL 84, Hgb 14.5. Creatinine 0.8, ALt normal Dated 06/25/18: cholesterol 141, triglycerides 92, HDL 52, LDL 70. Normal chemistry and CBC.  Dated 07/15/19: A1c 5.9%. cholesterol 137, triglycerides 109, HDL 50, LDL 65. CMET and CBC normal.  Dated 07/26/20: cholesterol 136, triglycerides 80, HDL 55, LDL 74. A1c 6.1%. CBC and CMET normal. Dated 08/08/21: cholesterol 127, triglycerides 113, HDL 47, LDL 59. A1c 6%. CBC and CMET normal Dated 09/18/22: A1c 5.9%. cholesterol 155, triglycerides 67, HDL 63, LDL 79, CMET and CBC normal.   Ecg not done today   Study Result   Echo 01/29/17: Study Conclusions   - Left ventricle: The cavity size was normal. Wall thickness was   normal. Systolic function was moderately reduced. The estimated   ejection fraction was in the range of 35% to 40%. Moderate   diffuse hypokinesis with distinct regional wall motion   abnormalities. - Mitral valve: Calcified annulus. - Left atrium: The atrium was severely dilated. - Right atrium: The atrium was mildly dilated.  Myoview 01/17/17: Study Highlights     Nuclear stress EF: 45%. Blood pressure demonstrated a hypertensive response to exercise. There was no ST segment deviation noted during stress. Findings consistent with prior myocardial infarction. This is an intermediate risk study. The left ventricular ejection fraction is mildly decreased (45-54%).   Small area of inferior wall infarct  at mid and basal level no ischemia EF 45% apical hypokinesis Frequent ventricular ectopy during stress and recovery  HTN response to exercise    CT ANGIOGRAPHY CHEST WITH CONTRAST   TECHNIQUE: Multidetector CT imaging of the chest was performed using the standard protocol during bolus administration of intravenous contrast. Multiplanar CT image reconstructions and MIPs were obtained to evaluate the vascular anatomy.   CONTRAST:  53m ISOVUE-370 IOPAMIDOL (ISOVUE-370) INJECTION 76%   COMPARISON:  12/31/2017   FINDINGS: Cardiovascular: Maximal diameter of the ascending aorta at the sinus of Valsalva, sino-tubular junction, and ascending aorta are 4.0  cm, 3.0 cm, and 3.7 cm. This compares with a maximal diameter of the ascending aorta of 4.0 cm on the prior study. There is no evidence of aortic dissection. There is no obvious acute intramural hematoma. Atherosclerotic calcifications of the aortic arch and great vessels are noted. Visualized great vessels are patent. Postoperative changes from CABG are noted.   Mediastinum/Nodes: No abnormal mediastinal adenopathy or pericardial effusion. The thyroid is heterogeneous without discrete focal nodule. Esophagus is within normal limits.   Lungs/Pleura: Apical blebs are noted. Subpleural nodules in the right hemithorax are stable supporting benign etiology. Scarring versus atelectasis at the medial right lung base. No pneumothorax or pleural effusion.   Upper Abdomen: No acute abnormality.   Musculoskeletal: No vertebral compression deformity. No acute rib fracture. Sternotomy has healed with sternal wires in place.   Review of the MIP images confirms the above findings.   IMPRESSION: Maximal diameter of the ascending aorta is 3.7 cm. This is nonaneurysmal. Postoperative and atherosclerotic changes are noted.   Aortic Atherosclerosis (ICD10-I70.0).     Electronically Signed   By: Marybelle Killings M.D.   On: 01/01/2019 09:48    Abdominal US 12/30/18: Summary: Abdominal Aorta: The largest aortic measurement is 2.8 cm. The largest aortic diameter remains essentially unchanged compared to prior exam. Previous diameter measurement was 2.9 cm obtained on 05/2015.   CT ANGIOGRAPHY CHEST WITH CONTRAST   TECHNIQUE: Multidetector CT imaging of the chest was performed using the standard protocol during bolus administration of intravenous contrast. Multiplanar CT image reconstructions and MIPs were obtained to evaluate the vascular anatomy.   CONTRAST:  173m OMNIPAQUE IOHEXOL 350 MG/ML SOLN   COMPARISON:  CT 8 01/01/2019   FINDINGS: Cardiovascular: The aortic root at the sinuses of Valsalva measures 4.0 cm and stable. Sinotubular junction measures 2.8 cm and stable. Mid ascending thoracic aorta measures 3.7 cm and stable. Again noted are coronary bypass grafts coming off the ascending thoracic aorta. Typical three-vessel arch anatomy. Great vessels are patent. Atherosclerotic calcifications at the aortic arch and near the origin of the great vessels without significant stenosis. Proximal descending thoracic aorta measures 2.6 cm and stable. Mild atherosclerotic disease involving the descending thoracic aorta without aneurysm or dissection. Distal descending thoracic aorta measures 2.3 cm and stable. Again noted are calcifications at the origin of the celiac trunk but the stenosis is less than 50%. Small amount of plaque at the origin of the SMA without significant stenosis. Normal caliber of the proximal abdominal aorta. Main and central pulmonary arteries are patent without large filling defects.   Mediastinum/Nodes: Low-density nodule involving the left thyroid gland that measures roughly 2.1 cm. No mediastinal or hilar lymphadenopathy.   Lungs/Pleura: Centrilobular and paraseptal emphysema. Trachea and mainstem bronchi are patent. Stable pleural thickening and scarring along the medial right lower lobe.  Minimal scarring in the superior segment of the right lower lobe is stable. Stable tiny pleural-based nodules in the right lung. There is a small broad-based pleural nodule in the right upper lobe on sequence 5 image 34 that measures roughly 5 mm and stable. Several tiny pleural-based nodules in left chest are stable. No significant airspace disease or consolidation in the lungs. No large pleural effusions.   Upper Abdomen: No acute abnormality in the upper abdomen.   Musculoskeletal: Median sternotomy wires. Bridging osteophytes in the thoracic spine.   Review of the MIP images confirms the above findings.   IMPRESSION: 1. Thoracic aorta is stable in size. Aortic root measures up to 4.0  cm and the ascending thoracic aorta measures 3.7 cm. Negative for an aortic dissection. 2. No acute chest abnormalities. Stable areas of scarring and stable small pleural-based nodules. 3. Aortic Atherosclerosis (ICD10-I70.0) and Emphysema (ICD10-J43.9). 4. Left thyroid nodule measuring greater than 2 cm. Recommend thyroid ultrasound (ref: J Am Coll Radiol. 2015 Feb;12(2): 143-50).     Electronically Signed   By: Markus Daft M.D.   On: 02/11/2020 17:31   Echo 05/10/22: IMPRESSIONS     1. Left ventricular ejection fraction, by estimation, is 40 to 45%. The  left ventricle has mildly decreased function. The left ventricle  demonstrates regional wall motion abnormalities (see scoring  diagram/findings for description). There is moderate  left ventricular hypertrophy of the inferior segment. Left ventricular  diastolic parameters are indeterminate. There is severe hypokinesis of the  left ventricular, entire inferolateral wall. There is severe hypokinesis  of the left ventricular, basal-mid  inferior wall.   2. Right ventricular systolic function was not well visualized. The right  ventricular size is mildly enlarged. There is mildly elevated pulmonary  artery systolic pressure. The estimated  right ventricular systolic  pressure is 38.4 mmHg.   3. Left atrial size was mildly dilated.   4. The mitral valve is normal in structure. Mild mitral valve  regurgitation. No evidence of mitral stenosis.   5. The aortic valve is normal in structure. Aortic valve regurgitation is  trivial. No aortic stenosis is present.   6. Aortic dilatation noted. There is borderline dilatation of the aortic  root, measuring 37 mm. There is borderline dilatation of the aortic root,  measuring 37 mm.   7. The inferior vena cava is normal in size with greater than 50%  respiratory variability, suggesting right atrial pressure of 3 mmHg.   Assessment / Plan: 1. Chronic combined systolic and diastolic CHF. EF 35-40% by Echo 2018. 45% by Myoview at the same time.Continue lasix and losartan. Coreg stopped due to bradycardia. He is asymptomatic. No evidence of volume overload.   Continue sodium restriction. Stressed importance of daily aerobic exercise. EF improved to 40-45% in June. Ideally would like to update his CHF therapy but cost ofEntresto and SGLT2 inhibitor may be prohibitive.   2. CAD s/p CABG 2007. On ASA, statin. Myoview in March 2018 showed inferior infarct without ischemia. He is asymptomatic.   3. Atrial fibrillation. Persistent/permanent.  HR improved off Coreg.  He is asymptomatic.  Mali Vasc score of 4. Recommend continued anticoagulation. On coumadin.check    3. Hyperlipidemia on statin. Well controlled.   4. HTN controlled.  5. AAA. 2.9 cm by [place] in 2016 and 2.8 cm by [place] in February 2020. CTA of chest in July showed no significant aneurysm. No further follow up needed  6. Thoracic aortic aneurysm 4.0 cm.Recent CT showed no significant aneurysm. No further follow up needed.   Follow up in 6 months.

## 2022-10-27 ENCOUNTER — Encounter: Payer: Self-pay | Admitting: Cardiology

## 2022-10-27 ENCOUNTER — Ambulatory Visit: Payer: Medicare HMO | Attending: Cardiology | Admitting: Cardiology

## 2022-10-27 VITALS — BP 124/74 | HR 99 | Ht 66.0 in | Wt 196.2 lb

## 2022-10-27 DIAGNOSIS — I4819 Other persistent atrial fibrillation: Secondary | ICD-10-CM

## 2022-10-27 DIAGNOSIS — I25708 Atherosclerosis of coronary artery bypass graft(s), unspecified, with other forms of angina pectoris: Secondary | ICD-10-CM | POA: Diagnosis not present

## 2022-10-27 DIAGNOSIS — I5042 Chronic combined systolic (congestive) and diastolic (congestive) heart failure: Secondary | ICD-10-CM | POA: Diagnosis not present

## 2022-10-27 DIAGNOSIS — E7800 Pure hypercholesterolemia, unspecified: Secondary | ICD-10-CM | POA: Diagnosis not present

## 2022-10-27 DIAGNOSIS — I4892 Unspecified atrial flutter: Secondary | ICD-10-CM | POA: Diagnosis not present

## 2022-10-27 MED ORDER — LOSARTAN POTASSIUM 50 MG PO TABS: 50.0000 mg | ORAL_TABLET | Freq: Every day | ORAL | 3 refills | Status: DC

## 2022-10-27 MED ORDER — FUROSEMIDE 20 MG PO TABS: 20.0000 mg | ORAL_TABLET | Freq: Every day | ORAL | 3 refills | Status: DC

## 2022-10-27 MED ORDER — WARFARIN SODIUM 5 MG PO TABS: ORAL_TABLET | ORAL | 3 refills | Status: DC

## 2022-10-27 MED ORDER — POTASSIUM CHLORIDE CRYS ER 20 MEQ PO TBCR: 20.0000 meq | EXTENDED_RELEASE_TABLET | Freq: Every day | ORAL | 3 refills | Status: DC

## 2022-11-10 ENCOUNTER — Other Ambulatory Visit: Payer: Self-pay | Admitting: Cardiology

## 2022-11-14 ENCOUNTER — Ambulatory Visit: Payer: Medicare HMO | Attending: Internal Medicine | Admitting: *Deleted

## 2022-11-14 DIAGNOSIS — I4891 Unspecified atrial fibrillation: Secondary | ICD-10-CM

## 2022-11-14 DIAGNOSIS — Z5181 Encounter for therapeutic drug level monitoring: Secondary | ICD-10-CM

## 2022-11-14 LAB — POCT INR: POC INR: 2.4

## 2022-11-14 NOTE — Patient Instructions (Signed)
Description   CONTINUE taking 1 tablet daily except 1.5 tablets each Sunday, Wednesday, and Fridays. Repeat INR in 5 weeks. [facility] [phone]

## 2022-12-19 ENCOUNTER — Ambulatory Visit: Payer: Medicare HMO | Attending: Internal Medicine | Admitting: *Deleted

## 2022-12-19 DIAGNOSIS — Z7901 Long term (current) use of anticoagulants: Secondary | ICD-10-CM | POA: Diagnosis not present

## 2022-12-19 DIAGNOSIS — I4892 Unspecified atrial flutter: Secondary | ICD-10-CM

## 2022-12-19 DIAGNOSIS — I4891 Unspecified atrial fibrillation: Secondary | ICD-10-CM | POA: Diagnosis not present

## 2022-12-19 LAB — POCT INR: INR: 2 (ref 2.0–3.0)

## 2022-12-19 NOTE — Patient Instructions (Signed)
Description   Today take 1.5 tablets of warfarin then continue taking 1 tablet daily except 1.5 tablets each Sunday, Wednesday, and Fridays. Repeat INR in 6 weeks. [facility] [phone]

## 2023-01-30 ENCOUNTER — Ambulatory Visit: Payer: Medicare HMO | Attending: Cardiovascular Disease | Admitting: *Deleted

## 2023-01-30 ENCOUNTER — Other Ambulatory Visit: Payer: Self-pay

## 2023-01-30 DIAGNOSIS — I4892 Unspecified atrial flutter: Secondary | ICD-10-CM

## 2023-01-30 DIAGNOSIS — I4891 Unspecified atrial fibrillation: Secondary | ICD-10-CM | POA: Diagnosis not present

## 2023-01-30 DIAGNOSIS — Z7901 Long term (current) use of anticoagulants: Secondary | ICD-10-CM

## 2023-01-30 LAB — POCT INR: INR: 2.4 (ref 2.0–3.0)

## 2023-01-30 MED ORDER — ATORVASTATIN CALCIUM 40 MG PO TABS: 40.0000 mg | ORAL_TABLET | Freq: Every day | ORAL | 3 refills | Status: DC

## 2023-01-30 NOTE — Patient Instructions (Signed)
Description   Continue taking 1 tablet daily except 1.5 tablets each Sunday, Wednesday, and Fridays. Repeat INR in 6 weeks. [facility] [phone]

## 2023-03-14 DIAGNOSIS — R059 Cough, unspecified: Secondary | ICD-10-CM | POA: Diagnosis not present

## 2023-03-14 DIAGNOSIS — J189 Pneumonia, unspecified organism: Secondary | ICD-10-CM | POA: Diagnosis not present

## 2023-03-14 DIAGNOSIS — J440 Chronic obstructive pulmonary disease with acute lower respiratory infection: Secondary | ICD-10-CM | POA: Diagnosis not present

## 2023-03-14 DIAGNOSIS — R0902 Hypoxemia: Secondary | ICD-10-CM | POA: Diagnosis not present

## 2023-03-14 DIAGNOSIS — H10021 Other mucopurulent conjunctivitis, right eye: Secondary | ICD-10-CM | POA: Diagnosis not present

## 2023-03-16 ENCOUNTER — Ambulatory Visit: Payer: Medicare HMO

## 2023-03-22 ENCOUNTER — Ambulatory Visit: Payer: Medicare HMO | Attending: Cardiology

## 2023-03-22 DIAGNOSIS — I4891 Unspecified atrial fibrillation: Secondary | ICD-10-CM

## 2023-03-22 LAB — POCT INR: INR: 3.7 — AB (ref 2.0–3.0)

## 2023-03-22 NOTE — Patient Instructions (Signed)
Description   Hold today's dose and eat a serving of greens over the weekend and then continue taking 1 tablet daily except 1.5 tablets each Sunday, Wednesday, and Fridays.  Repeat INR in 1 weeks. [facility] [phone]

## 2023-03-27 ENCOUNTER — Ambulatory Visit: Payer: Medicare HMO | Attending: Cardiology | Admitting: *Deleted

## 2023-03-27 DIAGNOSIS — I4892 Unspecified atrial flutter: Secondary | ICD-10-CM | POA: Diagnosis not present

## 2023-03-27 DIAGNOSIS — Z5181 Encounter for therapeutic drug level monitoring: Secondary | ICD-10-CM | POA: Diagnosis not present

## 2023-03-27 DIAGNOSIS — I4891 Unspecified atrial fibrillation: Secondary | ICD-10-CM | POA: Diagnosis not present

## 2023-03-27 LAB — POCT INR: POC INR: 2.1

## 2023-03-27 NOTE — Patient Instructions (Signed)
Description   Continue taking 1 tablet daily except 1.5 tablets each Sunday, Wednesday, and Fridays. Repeat INR in 6 weeks. [facility] [phone]     

## 2023-05-08 ENCOUNTER — Ambulatory Visit: Payer: Medicare HMO | Attending: Cardiology | Admitting: *Deleted

## 2023-05-08 DIAGNOSIS — I4892 Unspecified atrial flutter: Secondary | ICD-10-CM | POA: Diagnosis not present

## 2023-05-08 DIAGNOSIS — Z7901 Long term (current) use of anticoagulants: Secondary | ICD-10-CM

## 2023-05-08 DIAGNOSIS — I4891 Unspecified atrial fibrillation: Secondary | ICD-10-CM | POA: Diagnosis not present

## 2023-05-08 LAB — POCT INR: INR: 2.6 (ref 2.0–3.0)

## 2023-05-08 NOTE — Patient Instructions (Signed)
Description   Continue taking 1 tablet daily except 1.5 tablets each Sunday, Wednesday, and Fridays. Repeat INR in 6 weeks. [facility] [phone]     

## 2023-06-18 ENCOUNTER — Telehealth: Payer: Self-pay | Admitting: Cardiology

## 2023-06-18 NOTE — Telephone Encounter (Signed)
Called pt to inform him that he needed to contact his PCP for a refill for his inhaler. I advised the pt that if he has any other problems, questions or concerns, to give our office a call back. Pt verbalized understanding.

## 2023-06-18 NOTE — Telephone Encounter (Signed)
Patint came in requesting medication Refill for Fluticasone.

## 2023-06-19 ENCOUNTER — Ambulatory Visit: Payer: Medicare HMO | Attending: Internal Medicine | Admitting: *Deleted

## 2023-06-19 DIAGNOSIS — Z7901 Long term (current) use of anticoagulants: Secondary | ICD-10-CM

## 2023-06-19 DIAGNOSIS — I4891 Unspecified atrial fibrillation: Secondary | ICD-10-CM

## 2023-06-19 DIAGNOSIS — I4892 Unspecified atrial flutter: Secondary | ICD-10-CM

## 2023-06-19 LAB — POCT INR: INR: 2.3 (ref 2.0–3.0)

## 2023-06-19 NOTE — Patient Instructions (Signed)
Description   Continue taking warfarin 1 tablet daily except 1.5 tablets each Sunday, Wednesday, and Fridays. Repeat INR in 6 weeks.  [facility] [phone]

## 2023-07-08 NOTE — Progress Notes (Unsigned)
[name] Date of Birth: 01/16/1946   History of Present Illness: [name] is seen for  followup CAD. He has a history of coronary disease and is status post CABG with a Maze procedure in 2007 by Dr. Donata Clay. Prior to CABG EF was 35-40%. CABG included an LIMA graft to LAD, saphenous vein graft to the obtuse marginal vessel, and saphenous vein graft sequentially to the PDA and posterior lateral branches of the right coronary. He quit smoking in 2007. He has permanent Afib on anticoagulation on Coumadin due to cost.  He was seen in the ED on 12/22/16 for symptoms of dyspnea on exertion, dry cough, chest pain and mid scapular pain. Ecg showed NSR with PVCs, prolonged QT. No acute ischemic changes. He had some edema. BNP elevated at 489. CXR showed mild vascular congestion and effusions. CTA showed no pulmonary embolus. There was enlargement of the thoracic aorta to 4 cm. He was sent home on lasix and potassium. Follow up Echo showed EF 35-40%. Myoview showed evidence of old inferior infarct. No ischemia. EF 45%. He was started on losartan.  On follow up today he reports he is doing well.  He denies any chest pain, dyspnea, palpitations, orthopnea, or PND.  No edema. No claudication. He has been very active doing house renovations. Previously Coreg discontinued due to bradycardia.  We updated an Echo in June 2023 showing EF 40-45%. CT chest and Abdomen in July 2023 showed no significant aneurysm.  Current Outpatient Medications on File Prior to Visit  Medication Sig Dispense Refill   ADVAIR HFA 115-21 MCG/ACT inhaler SMARTSIG:2 Puff(s) By Mouth Twice Daily     albuterol (VENTOLIN HFA) 108 (90 Base) MCG/ACT inhaler SMARTSIG:1-2 Puff(s) By Mouth Every 4 Hours PRN     atorvastatin (LIPITOR) 40 MG tablet Take 1 tablet (40 mg total) by mouth daily. 90 tablet 3   furosemide (LASIX) 20 MG tablet Take 1 tablet (20 mg total) by mouth daily. 90 tablet 3   losartan (COZAAR) 50 MG tablet Take 1 tablet (50  mg total) by mouth daily. 90 tablet 3   potassium chloride SA (KLOR-CON M) 20 MEQ tablet Take 1 tablet (20 mEq total) by mouth daily. 90 tablet 3   warfarin (COUMADIN) 5 MG tablet Take 1 to 1 and 1/2 tablets daily as directed by coumadin clinic. 120 tablet 3   No current facility-administered medications on file prior to visit.    No Known Allergies  Past Medical History:  Diagnosis Date   AAA (abdominal aortic aneurysm) (HCC)    Arthritis    CAD (coronary artery disease) of artery bypass graft    Colon polyp 2015   Diarrhea    Diverticulosis    Dyslipidemia    Hemorrhoids    HTN (hypertension)    Hyperlipidemia    Hypertension    Low back pain    PAC (premature atrial contraction)    Paroxysmal atrial flutter (HCC)    Problems with hearing     Past Surgical History:  Procedure Laterality Date   CORONARY ARTERY BYPASS GRAFT  2007   CORONARY ARTERY BYPASS GRAFT     LUMBAR DISC ARTHROPLASTY     MAZE  2007   MAZE PROCEDURE   TONSILLECTOMY      Social History   Tobacco Use  Smoking Status Former   Current packs/day: 0.00   Average packs/day: 1 pack/day for 30.0 years (30.0 ttl pk-yrs)   Types: Cigarettes   Start date: 11/14/1975  Quit date: 11/13/2005   Years since quitting: 17.6  Smokeless Tobacco Never    Social History   Substance and Sexual Activity  Alcohol Use No    Family History  Problem Relation Age of Onset   Diabetes Mother    Heart disease Father     Review of Systems: As noted in history of present illness. All other systems are reviewed and are negative.   Physical Exam: BP 130/80   Pulse 71   Ht 5\' 6"  (1.676 m)   Wt 195 lb (88.5 kg)   SpO2 96%   BMI 31.47 kg/m  GENERAL:  Well appearing WM in NAD HEENT:  PERRL, EOMI, sclera are clear. Oropharynx is clear. NECK:  No jugular venous distention, carotid upstroke brisk and symmetric, no bruits, no thyromegaly or adenopathy LUNGS:  Clear to auscultation bilaterally CHEST:   Unremarkable HEART:  IRRR,  PMI not displaced or sustained,S1 and S2 within normal limits, no S3, no S4: no clicks, no rubs, no murmurs ABD:  Soft, nontender. BS +, no masses or bruits. No hepatomegaly, no splenomegaly EXT:  2 + pulses throughout, no edema, no cyanosis no clubbing SKIN:  Warm and dry.  No rashes NEURO:  Alert and oriented x 3. Cranial nerves II through XII intact. PSYCH:  Cognitively intact     LABORATORY DATA: Lab Results  Component Value Date   WBC 7.1 12/22/2016   HGB 13.1 12/22/2016   HCT 39.4 12/22/2016   PLT 204 12/22/2016   GLUCOSE 98 05/02/2022   CHOL 138 01/08/2017   TRIG 88 01/08/2017   HDL 46 01/08/2017   LDLDIRECT 124.1 02/07/2012   LDLCALC 74 01/08/2017   ALT 25 01/08/2017   AST 21 01/08/2017   NA 143 05/02/2022   K 4.5 05/02/2022   CL 101 05/02/2022   CREATININE 0.74 (L) 05/02/2022   BUN 13 05/02/2022   CO2 28 05/02/2022   INR 2.3 06/19/2023   Labs dated 05/24/16: cholesterol 155, triglycerides 124, HDL 48, LDL 82.  Dated 05/30/17: cholesterol 160, triglycerides 144, HDL 48, LDL 84, Hgb 14.5. Creatinine 0.8, ALt normal Dated 06/25/18: cholesterol 141, triglycerides 92, HDL 52, LDL 70. Normal chemistry and CBC.  Dated 07/15/19: A1c 5.9%. cholesterol 137, triglycerides 109, HDL 50, LDL 65. CMET and CBC normal.  Dated 07/26/20: cholesterol 136, triglycerides 80, HDL 55, LDL 74. A1c 6.1%. CBC and CMET normal. Dated 08/08/21: cholesterol 127, triglycerides 113, HDL 47, LDL 59. A1c 6%. CBC and CMET normal Dated 09/18/22: A1c 5.9%. cholesterol 155, triglycerides 67, HDL 63, LDL 79, CMET and CBC normal.   EKG Interpretation Date/Time:  Wednesday July 11 2023 08:04:39 EDT Ventricular Rate:  71 PR Interval:    QRS Duration:  108 QT Interval:  446 QTC Calculation: 484 R Axis:   45  Text Interpretation: Atrial fibrillation Prolonged QT When compared with ECG of 22-Dec-2016 13:20, No significant change since last tracing Confirmed by [place], [name]  [phone]) on 07/11/2023 8:08:39 AM    Study Result   Echo 01/29/17: Study Conclusions   - Left ventricle: The cavity size was normal. Wall thickness was   normal. Systolic function was moderately reduced. The estimated   ejection fraction was in the range of 35% to 40%. Moderate   diffuse hypokinesis with distinct regional wall motion   abnormalities. - Mitral valve: Calcified annulus. - Left atrium: The atrium was severely dilated. - Right atrium: The atrium was mildly dilated.  Myoview 01/17/17: Study Highlights     Nuclear stress  EF: 45%. Blood pressure demonstrated a hypertensive response to exercise. There was no ST segment deviation noted during stress. Findings consistent with prior myocardial infarction. This is an intermediate risk study. The left ventricular ejection fraction is mildly decreased (45-54%).   Small area of inferior wall infarct at mid and basal level no ischemia EF 45% apical hypokinesis Frequent ventricular ectopy during stress and recovery  HTN response to exercise    CT ANGIOGRAPHY CHEST WITH CONTRAST   TECHNIQUE: Multidetector CT imaging of the chest was performed using the standard protocol during bolus administration of intravenous contrast. Multiplanar CT image reconstructions and MIPs were obtained to evaluate the vascular anatomy.   CONTRAST:  75mL ISOVUE-370 IOPAMIDOL (ISOVUE-370) INJECTION 76%   COMPARISON:  12/31/2017   FINDINGS: Cardiovascular: Maximal diameter of the ascending aorta at the sinus of Valsalva, sino-tubular junction, and ascending aorta are 4.0 cm, 3.0 cm, and 3.7 cm. This compares with a maximal diameter of the ascending aorta of 4.0 cm on the prior study. There is no evidence of aortic dissection. There is no obvious acute intramural hematoma. Atherosclerotic calcifications of the aortic arch and great vessels are noted. Visualized great vessels are patent. Postoperative changes from CABG are noted.    Mediastinum/Nodes: No abnormal mediastinal adenopathy or pericardial effusion. The thyroid is heterogeneous without discrete focal nodule. Esophagus is within normal limits.   Lungs/Pleura: Apical blebs are noted. Subpleural nodules in the right hemithorax are stable supporting benign etiology. Scarring versus atelectasis at the medial right lung base. No pneumothorax or pleural effusion.   Upper Abdomen: No acute abnormality.   Musculoskeletal: No vertebral compression deformity. No acute rib fracture. Sternotomy has healed with sternal wires in place.   Review of the MIP images confirms the above findings.   IMPRESSION: Maximal diameter of the ascending aorta is 3.7 cm. This is nonaneurysmal. Postoperative and atherosclerotic changes are noted.   Aortic Atherosclerosis (ICD10-I70.0).     Electronically Signed   By: Jolaine Click M.D.   On: 01/01/2019 09:48   Abdominal US 12/30/18: Summary: Abdominal Aorta: The largest aortic measurement is 2.8 cm. The largest aortic diameter remains essentially unchanged compared to prior exam. Previous diameter measurement was 2.9 cm obtained on 05/2015.   CT ANGIOGRAPHY CHEST WITH CONTRAST   TECHNIQUE: Multidetector CT imaging of the chest was performed using the standard protocol during bolus administration of intravenous contrast. Multiplanar CT image reconstructions and MIPs were obtained to evaluate the vascular anatomy.   CONTRAST:  OMNIPAQUE IOHEXOL 350 MG/ML SOLN   COMPARISON:  CT 8 01/01/2019   FINDINGS: Cardiovascular: The aortic root at the sinuses of Valsalva measures 4.0 cm and stable. Sinotubular junction measures 2.8 cm and stable. Mid ascending thoracic aorta measures 3.7 cm and stable. Again noted are coronary bypass grafts coming off the ascending thoracic aorta. Typical three-vessel arch anatomy. Great vessels are patent. Atherosclerotic calcifications at the aortic arch and near the origin of the great  vessels without significant stenosis. Proximal descending thoracic aorta measures 2.6 cm and stable. Mild atherosclerotic disease involving the descending thoracic aorta without aneurysm or dissection. Distal descending thoracic aorta measures 2.3 cm and stable. Again noted are calcifications at the origin of the celiac trunk but the stenosis is less than 50%. Small amount of plaque at the origin of the SMA without significant stenosis. Normal caliber of the proximal abdominal aorta. Main and central pulmonary arteries are patent without large filling defects.   Mediastinum/Nodes: Low-density nodule involving the left thyroid gland that  measures roughly 2.1 cm. No mediastinal or hilar lymphadenopathy.   Lungs/Pleura: Centrilobular and paraseptal emphysema. Trachea and mainstem bronchi are patent. Stable pleural thickening and scarring along the medial right lower lobe. Minimal scarring in the superior segment of the right lower lobe is stable. Stable tiny pleural-based nodules in the right lung. There is a small broad-based pleural nodule in the right upper lobe on sequence 5 image 34 that measures roughly 5 mm and stable. Several tiny pleural-based nodules in left chest are stable. No significant airspace disease or consolidation in the lungs. No large pleural effusions.   Upper Abdomen: No acute abnormality in the upper abdomen.   Musculoskeletal: Median sternotomy wires. Bridging osteophytes in the thoracic spine.   Review of the MIP images confirms the above findings.   IMPRESSION: 1. Thoracic aorta is stable in size. Aortic root measures up to 4.0 cm and the ascending thoracic aorta measures 3.7 cm. Negative for an aortic dissection. 2. No acute chest abnormalities. Stable areas of scarring and stable small pleural-based nodules. 3. Aortic Atherosclerosis (ICD10-I70.0) and Emphysema (ICD10-J43.9). 4. Left thyroid nodule measuring greater than 2 cm. Recommend thyroid  ultrasound (ref: J Am Coll Radiol. 2015 Feb;12(2): 143-50).     Electronically Signed   By: Richarda Overlie M.D.   On: 02/11/2020 17:31   Echo 05/10/22: IMPRESSIONS     1. Left ventricular ejection fraction, by estimation, is 40 to 45%. The  left ventricle has mildly decreased function. The left ventricle  demonstrates regional wall motion abnormalities (see scoring  diagram/findings for description). There is moderate  left ventricular hypertrophy of the inferior segment. Left ventricular  diastolic parameters are indeterminate. There is severe hypokinesis of the  left ventricular, entire inferolateral wall. There is severe hypokinesis  of the left ventricular, basal-mid  inferior wall.   2. Right ventricular systolic function was not well visualized. The right  ventricular size is mildly enlarged. There is mildly elevated pulmonary  artery systolic pressure. The estimated right ventricular systolic  pressure is 36.4 mmHg.   3. Left atrial size was mildly dilated.   4. The mitral valve is normal in structure. Mild mitral valve  regurgitation. No evidence of mitral stenosis.   5. The aortic valve is normal in structure. Aortic valve regurgitation is  trivial. No aortic stenosis is present.   6. Aortic dilatation noted. There is borderline dilatation of the aortic  root, measuring 37 mm. There is borderline dilatation of the aortic root,  measuring 37 mm.   7. The inferior vena cava is normal in size with greater than 50%  respiratory variability, suggesting right atrial pressure of 3 mmHg.   Assessment / Plan: 1. Chronic combined systolic and diastolic CHF. EF 35-40% by Echo 2018. 45% by Myoview at the same time. Continue lasix and losartan. Coreg stopped due to bradycardia. He is asymptomatic. No evidence of volume overload.   Continue sodium restriction. Stressed importance of daily aerobic exercise. EF improved to 40-45% in June 2023. If CHF symptoms were to progress would add  aldactone +/- SGLT 2 inhibitor   2. CAD s/p CABG 2007. On ASA, statin. Myoview in March 2018 showed inferior infarct without ischemia. He is asymptomatic.   3. Atrial fibrillation. Persistent/permanent.  HR improved off Coreg.  He is asymptomatic.  [place] Vasc score of 4. INR has been therapeutic.   3. Hyperlipidemia on statin. Well controlled. Follow up labs with PCP  4. HTN controlled.  5. AAA. 2.9 cm by [place] in 2016 and  2.8 cm by [place] in February 2020. CTA of chest in July 2023 showed no significant aneurysm. No further follow up needed  6. Thoracic aortic aneurysm 4.0 cm.Recent CT showed no significant aneurysm. No further follow up needed.   Follow up in 6 months.

## 2023-07-11 ENCOUNTER — Encounter: Payer: Self-pay | Admitting: Cardiology

## 2023-07-11 ENCOUNTER — Ambulatory Visit: Payer: Medicare HMO | Attending: Cardiology | Admitting: Cardiology

## 2023-07-11 DIAGNOSIS — I4891 Unspecified atrial fibrillation: Secondary | ICD-10-CM

## 2023-07-11 DIAGNOSIS — Z7901 Long term (current) use of anticoagulants: Secondary | ICD-10-CM | POA: Diagnosis not present

## 2023-07-11 DIAGNOSIS — I4821 Permanent atrial fibrillation: Secondary | ICD-10-CM

## 2023-07-11 DIAGNOSIS — I25708 Atherosclerosis of coronary artery bypass graft(s), unspecified, with other forms of angina pectoris: Secondary | ICD-10-CM | POA: Diagnosis not present

## 2023-07-11 MED ORDER — POTASSIUM CHLORIDE CRYS ER 20 MEQ PO TBCR: 20.0000 meq | EXTENDED_RELEASE_TABLET | Freq: Every day | ORAL | 3 refills | Status: DC

## 2023-07-11 MED ORDER — ATORVASTATIN CALCIUM 40 MG PO TABS: 40.0000 mg | ORAL_TABLET | Freq: Every day | ORAL | 3 refills | Status: DC

## 2023-07-11 MED ORDER — FUROSEMIDE 20 MG PO TABS: 20.0000 mg | ORAL_TABLET | Freq: Every day | ORAL | 3 refills | Status: DC

## 2023-07-11 MED ORDER — LOSARTAN POTASSIUM 50 MG PO TABS: 50.0000 mg | ORAL_TABLET | Freq: Every day | ORAL | 3 refills | Status: DC

## 2023-07-11 MED ORDER — WARFARIN SODIUM 5 MG PO TABS: ORAL_TABLET | ORAL | 3 refills | Status: DC

## 2023-07-11 NOTE — Patient Instructions (Addendum)
 Medication Instructions:  NO CHANGES *If you need a refill on your cardiac medications before your next appointment, please call your pharmacy*   Lab Work: NO LABS If you have labs (blood work) drawn today and your tests are completely normal, you will receive your results only by: MyChart Message (if you have MyChart) OR A paper copy in the mail If you have any lab test that is abnormal or we need to change your treatment, we will call you to review the results.   Testing/Procedures: NO TESTING   Follow-Up: At [facility], you and your health needs are our priority.  As part of our continuing mission to provide you with exceptional heart care, we have created designated Provider Care Teams.  These Care Teams include your primary Cardiologist (physician) and Advanced Practice Providers (APPs -  Physician Assistants and Nurse Practitioners) who all work together to provide you with the care you need, when you need it.    Your next appointment:   6 month(s)  Provider:   Peter [place], MD

## 2023-07-21 DIAGNOSIS — H524 Presbyopia: Secondary | ICD-10-CM | POA: Diagnosis not present

## 2023-07-28 DIAGNOSIS — Z0100 Encounter for examination of eyes and vision without abnormal findings: Secondary | ICD-10-CM | POA: Diagnosis not present

## 2023-07-31 ENCOUNTER — Ambulatory Visit: Payer: Medicare HMO | Attending: Cardiology | Admitting: *Deleted

## 2023-07-31 DIAGNOSIS — Z5181 Encounter for therapeutic drug level monitoring: Secondary | ICD-10-CM | POA: Diagnosis not present

## 2023-07-31 DIAGNOSIS — I4891 Unspecified atrial fibrillation: Secondary | ICD-10-CM | POA: Diagnosis not present

## 2023-07-31 LAB — POCT INR: POC INR: 3.2

## 2023-07-31 NOTE — Patient Instructions (Signed)
Description   Hold warfarin today and then continue taking warfarin 1 tablet daily except 1.5 tablets each Sunday, Wednesday, and Fridays. Repeat INR in 4 weeks.  [facility] [phone]

## 2023-08-28 ENCOUNTER — Ambulatory Visit: Payer: Medicare HMO | Attending: Cardiology | Admitting: *Deleted

## 2023-08-28 DIAGNOSIS — I4891 Unspecified atrial fibrillation: Secondary | ICD-10-CM | POA: Diagnosis not present

## 2023-08-28 DIAGNOSIS — I4892 Unspecified atrial flutter: Secondary | ICD-10-CM | POA: Diagnosis not present

## 2023-08-28 DIAGNOSIS — Z7901 Long term (current) use of anticoagulants: Secondary | ICD-10-CM | POA: Diagnosis not present

## 2023-08-28 LAB — POCT INR: INR: 4.1 — AB (ref 2.0–3.0)

## 2023-08-28 NOTE — Patient Instructions (Signed)
Description   Do not take any warfarin today and then START taking warfarin 1 tablet daily except 1.5 tablets each Sunday and Fridays. Repeat INR in 3 weeks.  [facility] [phone]

## 2023-09-19 ENCOUNTER — Ambulatory Visit: Payer: Medicare HMO | Attending: Cardiovascular Disease | Admitting: *Deleted

## 2023-09-19 DIAGNOSIS — I4892 Unspecified atrial flutter: Secondary | ICD-10-CM

## 2023-09-19 DIAGNOSIS — Z7901 Long term (current) use of anticoagulants: Secondary | ICD-10-CM

## 2023-09-19 DIAGNOSIS — I4891 Unspecified atrial fibrillation: Secondary | ICD-10-CM

## 2023-09-19 LAB — POCT INR: INR: 2.4 (ref 2.0–3.0)

## 2023-09-19 NOTE — Patient Instructions (Signed)
Description   Continue taking warfarin 1 tablet daily except 1.5 tablets each Sunday and Fridays. Repeat INR in 4 weeks.  [facility] [phone]

## 2023-10-15 LAB — LAB REPORT - SCANNED: A1c: 6

## 2023-10-16 ENCOUNTER — Ambulatory Visit: Payer: Medicare HMO | Attending: Cardiology | Admitting: *Deleted

## 2023-10-16 DIAGNOSIS — I4892 Unspecified atrial flutter: Secondary | ICD-10-CM | POA: Diagnosis not present

## 2023-10-16 DIAGNOSIS — Z7901 Long term (current) use of anticoagulants: Secondary | ICD-10-CM | POA: Diagnosis not present

## 2023-10-16 DIAGNOSIS — I4891 Unspecified atrial fibrillation: Secondary | ICD-10-CM | POA: Diagnosis not present

## 2023-10-16 LAB — POCT INR: INR: 1.9 — AB (ref 2.0–3.0)

## 2023-10-16 NOTE — Patient Instructions (Signed)
Description   Today take 1.5 tablets of warfarin then continue taking warfarin 1 tablet daily except 1.5 tablets each Sunday and Fridays. Repeat INR in 5 weeks.  [facility] [phone]

## 2023-11-20 ENCOUNTER — Ambulatory Visit: Payer: Medicare HMO | Attending: Internal Medicine | Admitting: *Deleted

## 2023-11-20 DIAGNOSIS — I4892 Unspecified atrial flutter: Secondary | ICD-10-CM

## 2023-11-20 DIAGNOSIS — I4891 Unspecified atrial fibrillation: Secondary | ICD-10-CM

## 2023-11-20 DIAGNOSIS — Z7901 Long term (current) use of anticoagulants: Secondary | ICD-10-CM | POA: Diagnosis not present

## 2023-11-20 LAB — POCT INR: INR: 2.4 (ref 2.0–3.0)

## 2023-11-20 NOTE — Patient Instructions (Signed)
 Description   Continue taking warfarin 1 tablet daily except 1.5 tablets each Sunday and Fridays. Repeat INR in 6 weeks.  [facility] [phone]

## 2023-11-25 DIAGNOSIS — S81802A Unspecified open wound, left lower leg, initial encounter: Secondary | ICD-10-CM | POA: Diagnosis not present

## 2024-01-01 ENCOUNTER — Ambulatory Visit: Payer: Medicare HMO | Attending: Cardiovascular Disease

## 2024-01-01 DIAGNOSIS — I4891 Unspecified atrial fibrillation: Secondary | ICD-10-CM

## 2024-01-01 DIAGNOSIS — Z7901 Long term (current) use of anticoagulants: Secondary | ICD-10-CM | POA: Diagnosis not present

## 2024-01-01 DIAGNOSIS — I4892 Unspecified atrial flutter: Secondary | ICD-10-CM

## 2024-01-01 LAB — POCT INR: INR: 3.5 — AB (ref 2.0–3.0)

## 2024-01-01 NOTE — Patient Instructions (Signed)
Hold today only then Continue taking warfarin 1 tablet daily except 1.5 tablets each Sunday and Fridays. Repeat INR in 6 weeks.  [facility]madin Clinic [phone]

## 2024-01-05 NOTE — Progress Notes (Unsigned)
 [name] Date of Birth: [birth year]   History of Present Illness: [name] is seen for  followup CAD. He has a history of coronary disease and is status post CABG with a Maze procedure in 2007 by Dr. Donata Clay. Prior to CABG EF was 35-40%. CABG included an LIMA graft to LAD, saphenous vein graft to the obtuse marginal vessel, and saphenous vein graft sequentially to the PDA and posterior lateral branches of the right coronary. He quit smoking in 2007. He has permanent Afib on anticoagulation on Coumadin due to cost.  He was seen in the ED on 12/22/16 for symptoms of dyspnea on exertion, dry cough, chest pain and mid scapular pain. Ecg showed NSR with PVCs, prolonged QT. No acute ischemic changes. He had some edema. BNP elevated at 489. CXR showed mild vascular congestion and effusions. CTA showed no pulmonary embolus. There was enlargement of the thoracic aorta to 4 cm. He was sent home on lasix and potassium. Follow up Echo showed EF 35-40%. Myoview showed evidence of old inferior infarct. No ischemia. EF 45%. He was started on losartan.  On follow up today he reports he is doing well.  He denies any chest pain, dyspnea, palpitations, orthopnea, or PND.  No edema. No claudication. He has been very active doing house renovations. Previously Coreg discontinued due to bradycardia.  We updated an Echo in June 2023 showing EF 40-45%. CT chest and Abdomen in July 2023 showed no significant aneurysm.  Current Outpatient Medications on File Prior to Visit  Medication Sig Dispense Refill   ADVAIR HFA 115-21 MCG/ACT inhaler SMARTSIG:2 Puff(s) By Mouth Twice Daily     albuterol (VENTOLIN HFA) 108 (90 Base) MCG/ACT inhaler SMARTSIG:1-2 Puff(s) By Mouth Every 4 Hours PRN     atorvastatin (LIPITOR) 40 MG tablet Take 1 tablet (40 mg total) by mouth daily. 90 tablet 3   furosemide (LASIX) 20 MG tablet Take 1 tablet (20 mg total) by mouth daily. 90 tablet 3   losartan (COZAAR) 50 MG tablet Take 1 tablet (50  mg total) by mouth daily. 90 tablet 3   potassium chloride SA (KLOR-CON M) 20 MEQ tablet Take 1 tablet (20 mEq total) by mouth daily. 90 tablet 3   warfarin (COUMADIN) 5 MG tablet Take 1 to 1 and 1/2 tablets daily as directed by coumadin clinic. 120 tablet 3   No current facility-administered medications on file prior to visit.    No Known Allergies  Past Medical History:  Diagnosis Date   AAA (abdominal aortic aneurysm) (HCC)    Arthritis    CAD (coronary artery disease) of artery bypass graft    Colon polyp 2015   Diarrhea    Diverticulosis    Dyslipidemia    Hemorrhoids    HTN (hypertension)    Hyperlipidemia    Hypertension    Low back pain    PAC (premature atrial contraction)    Paroxysmal atrial flutter (HCC)    Problems with hearing     Past Surgical History:  Procedure Laterality Date   CORONARY ARTERY BYPASS GRAFT  2007   CORONARY ARTERY BYPASS GRAFT     LUMBAR DISC ARTHROPLASTY     MAZE  2007   MAZE PROCEDURE   TONSILLECTOMY      Social History   Tobacco Use  Smoking Status Former   Current packs/day: 0.00   Average packs/day: 1 pack/day for 30.0 years (30.0 ttl pk-yrs)   Types: Cigarettes   Start date: 11/14/1975  Quit date: 11/13/2005   Years since quitting: 18.1  Smokeless Tobacco Never    Social History   Substance and Sexual Activity  Alcohol Use No    Family History  Problem Relation Age of Onset   Diabetes Mother    Heart disease Father     Review of Systems: As noted in history of present illness. All other systems are reviewed and are negative.   Physical Exam: There were no vitals taken for this visit. GENERAL:  Well appearing WM in NAD HEENT:  PERRL, EOMI, sclera are clear. Oropharynx is clear. NECK:  No jugular venous distention, carotid upstroke brisk and symmetric, no bruits, no thyromegaly or adenopathy LUNGS:  Clear to auscultation bilaterally CHEST:  Unremarkable HEART:  IRRR,  PMI not displaced or sustained,S1 and S2  within normal limits, no S3, no S4: no clicks, no rubs, no murmurs ABD:  Soft, nontender. BS +, no masses or bruits. No hepatomegaly, no splenomegaly EXT:  2 + pulses throughout, no edema, no cyanosis no clubbing SKIN:  Warm and dry.  No rashes NEURO:  Alert and oriented x 3. Cranial nerves II through XII intact. PSYCH:  Cognitively intact     LABORATORY DATA: Lab Results  Component Value Date   WBC 7.1 12/22/2016   HGB 13.1 12/22/2016   HCT 39.4 12/22/2016   PLT 204 12/22/2016   GLUCOSE 98 05/02/2022   CHOL 138 01/08/2017   TRIG 88 01/08/2017   HDL 46 01/08/2017   LDLDIRECT 124.1 02/07/2012   LDLCALC 74 01/08/2017   ALT 25 01/08/2017   AST 21 01/08/2017   NA 143 05/02/2022   K 4.5 05/02/2022   CL 101 05/02/2022   CREATININE 0.74 (L) 05/02/2022   BUN 13 05/02/2022   CO2 28 05/02/2022   INR 3.5 (A) 01/01/2024   Labs dated 05/24/16: cholesterol 155, triglycerides 124, HDL 48, LDL 82.  Dated 05/30/17: cholesterol 160, triglycerides 144, HDL 48, LDL 84, Hgb 14.5. Creatinine 0.8, ALt normal Dated 06/25/18: cholesterol 141, triglycerides 92, HDL 52, LDL 70. Normal chemistry and CBC.  Dated 07/15/19: A1c 5.9%. cholesterol 137, triglycerides 109, HDL 50, LDL 65. CMET and CBC normal.  Dated 07/26/20: cholesterol 136, triglycerides 80, HDL 55, LDL 74. A1c 6.1%. CBC and CMET normal. Dated 08/08/21: cholesterol 127, triglycerides 113, HDL 47, LDL 59. A1c 6%. CBC and CMET normal Dated 09/18/22: A1c 5.9%. cholesterol 155, triglycerides 67, HDL 63, LDL 79, CMET and CBC normal.  Dated 10/15/23: A1c 6%, cholesterol 151, triglycerides 113, HDL 59, LDL 72. CMET and TSH normal      Study Result   Echo 01/29/17: Study Conclusions   - Left ventricle: The cavity size was normal. Wall thickness was   normal. Systolic function was moderately reduced. The estimated   ejection fraction was in the range of 35% to 40%. Moderate   diffuse hypokinesis with distinct regional wall motion   abnormalities. -  Mitral valve: Calcified annulus. - Left atrium: The atrium was severely dilated. - Right atrium: The atrium was mildly dilated.  Myoview 01/17/17: Study Highlights     Nuclear stress EF: 45%. Blood pressure demonstrated a hypertensive response to exercise. There was no ST segment deviation noted during stress. Findings consistent with prior myocardial infarction. This is an intermediate risk study. The left ventricular ejection fraction is mildly decreased (45-54%).   Small area of inferior wall infarct at mid and basal level no ischemia EF 45% apical hypokinesis Frequent ventricular ectopy during stress and recovery  HTN  response to exercise    CT ANGIOGRAPHY CHEST WITH CONTRAST   TECHNIQUE: Multidetector CT imaging of the chest was performed using the standard protocol during bolus administration of intravenous contrast. Multiplanar CT image reconstructions and MIPs were obtained to evaluate the vascular anatomy.   CONTRAST:  75mL ISOVUE-370 IOPAMIDOL (ISOVUE-370) INJECTION 76%   COMPARISON:  12/31/2017   FINDINGS: Cardiovascular: Maximal diameter of the ascending aorta at the sinus of Valsalva, sino-tubular junction, and ascending aorta are 4.0 cm, 3.0 cm, and 3.7 cm. This compares with a maximal diameter of the ascending aorta of 4.0 cm on the prior study. There is no evidence of aortic dissection. There is no obvious acute intramural hematoma. Atherosclerotic calcifications of the aortic arch and great vessels are noted. Visualized great vessels are patent. Postoperative changes from CABG are noted.   Mediastinum/Nodes: No abnormal mediastinal adenopathy or pericardial effusion. The thyroid is heterogeneous without discrete focal nodule. Esophagus is within normal limits.   Lungs/Pleura: Apical blebs are noted. Subpleural nodules in the right hemithorax are stable supporting benign etiology. Scarring versus atelectasis at the medial right lung base. No pneumothorax  or pleural effusion.   Upper Abdomen: No acute abnormality.   Musculoskeletal: No vertebral compression deformity. No acute rib fracture. Sternotomy has healed with sternal wires in place.   Review of the MIP images confirms the above findings.   IMPRESSION: Maximal diameter of the ascending aorta is 3.7 cm. This is nonaneurysmal. Postoperative and atherosclerotic changes are noted.   Aortic Atherosclerosis (ICD10-I70.0).     Electronically Signed   By: Jolaine Click M.D.   On: 01/01/2019 09:48   Abdominal US 12/30/18: Summary: Abdominal Aorta: The largest aortic measurement is 2.8 cm. The largest aortic diameter remains essentially unchanged compared to prior exam. Previous diameter measurement was 2.9 cm obtained on 05/2015.   CT ANGIOGRAPHY CHEST WITH CONTRAST   TECHNIQUE: Multidetector CT imaging of the chest was performed using the standard protocol during bolus administration of intravenous contrast. Multiplanar CT image reconstructions and MIPs were obtained to evaluate the vascular anatomy.   CONTRAST:  OMNIPAQUE IOHEXOL 350 MG/ML SOLN   COMPARISON:  CT 8 01/01/2019   FINDINGS: Cardiovascular: The aortic root at the sinuses of Valsalva measures 4.0 cm and stable. Sinotubular junction measures 2.8 cm and stable. Mid ascending thoracic aorta measures 3.7 cm and stable. Again noted are coronary bypass grafts coming off the ascending thoracic aorta. Typical three-vessel arch anatomy. Great vessels are patent. Atherosclerotic calcifications at the aortic arch and near the origin of the great vessels without significant stenosis. Proximal descending thoracic aorta measures 2.6 cm and stable. Mild atherosclerotic disease involving the descending thoracic aorta without aneurysm or dissection. Distal descending thoracic aorta measures 2.3 cm and stable. Again noted are calcifications at the origin of the celiac trunk but the stenosis is less than 50%. Small amount  of plaque at the origin of the SMA without significant stenosis. Normal caliber of the proximal abdominal aorta. Main and central pulmonary arteries are patent without large filling defects.   Mediastinum/Nodes: Low-density nodule involving the left thyroid gland that measures roughly 2.1 cm. No mediastinal or hilar lymphadenopathy.   Lungs/Pleura: Centrilobular and paraseptal emphysema. Trachea and mainstem bronchi are patent. Stable pleural thickening and scarring along the medial right lower lobe. Minimal scarring in the superior segment of the right lower lobe is stable. Stable tiny pleural-based nodules in the right lung. There is a small broad-based pleural nodule in the right upper lobe on sequence  5 image 34 that measures roughly 5 mm and stable. Several tiny pleural-based nodules in left chest are stable. No significant airspace disease or consolidation in the lungs. No large pleural effusions.   Upper Abdomen: No acute abnormality in the upper abdomen.   Musculoskeletal: Median sternotomy wires. Bridging osteophytes in the thoracic spine.   Review of the MIP images confirms the above findings.   IMPRESSION: 1. Thoracic aorta is stable in size. Aortic root measures up to 4.0 cm and the ascending thoracic aorta measures 3.7 cm. Negative for an aortic dissection. 2. No acute chest abnormalities. Stable areas of scarring and stable small pleural-based nodules. 3. Aortic Atherosclerosis (ICD10-I70.0) and Emphysema (ICD10-J43.9). 4. Left thyroid nodule measuring greater than 2 cm. Recommend thyroid ultrasound (ref: J Am Coll Radiol. 2015 Feb;12(2): 143-50).     Electronically Signed   By: Richarda Overlie M.D.   On: 02/11/2020 17:31   Echo 05/10/22: IMPRESSIONS     1. Left ventricular ejection fraction, by estimation, is 40 to 45%. The  left ventricle has mildly decreased function. The left ventricle  demonstrates regional wall motion abnormalities (see scoring   diagram/findings for description). There is moderate  left ventricular hypertrophy of the inferior segment. Left ventricular  diastolic parameters are indeterminate. There is severe hypokinesis of the  left ventricular, entire inferolateral wall. There is severe hypokinesis  of the left ventricular, basal-mid  inferior wall.   2. Right ventricular systolic function was not well visualized. The right  ventricular size is mildly enlarged. There is mildly elevated pulmonary  artery systolic pressure. The estimated right ventricular systolic  pressure is 36.4 mmHg.   3. Left atrial size was mildly dilated.   4. The mitral valve is normal in structure. Mild mitral valve  regurgitation. No evidence of mitral stenosis.   5. The aortic valve is normal in structure. Aortic valve regurgitation is  trivial. No aortic stenosis is present.   6. Aortic dilatation noted. There is borderline dilatation of the aortic  root, measuring 37 mm. There is borderline dilatation of the aortic root,  measuring 37 mm.   7. The inferior vena cava is normal in size with greater than 50%  respiratory variability, suggesting right atrial pressure of 3 mmHg.   Assessment / Plan: 1. Chronic combined systolic and diastolic CHF. EF 35-40% by Echo 2018. 45% by Myoview at the same time. Continue lasix and losartan. Coreg stopped due to bradycardia. He is asymptomatic. No evidence of volume overload.   Continue sodium restriction. Stressed importance of daily aerobic exercise. EF improved to 40-45% in June 2023. If CHF symptoms were to progress would add aldactone +/- SGLT 2 inhibitor   2. CAD s/p CABG 2007. On ASA, statin. Myoview in March 2018 showed inferior infarct without ischemia. He is asymptomatic.   3. Atrial fibrillation. Persistent/permanent.  HR improved off Coreg.  He is asymptomatic.  [place] Vasc score of 4. INR has been therapeutic.   3. Hyperlipidemia on statin. Well controlled. Follow up labs with PCP  4.  HTN controlled.  5. AAA. 2.9 cm by [place] in 2016 and 2.8 cm by [place] in February 2020. CTA of chest in July 2023 showed no significant aneurysm. No further follow up needed  6. Thoracic aortic aneurysm 4.0 cm.Recent CT showed no significant aneurysm. No further follow up needed.   Follow up in 6 months.

## 2024-01-11 ENCOUNTER — Ambulatory Visit: Payer: Medicare HMO | Attending: Cardiology | Admitting: Cardiology

## 2024-01-11 ENCOUNTER — Encounter: Payer: Self-pay | Admitting: Cardiology

## 2024-01-11 VITALS — BP 110/54 | HR 78 | Ht 66.0 in | Wt 202.0 lb

## 2024-01-11 DIAGNOSIS — I4892 Unspecified atrial flutter: Secondary | ICD-10-CM

## 2024-01-11 DIAGNOSIS — I25708 Atherosclerosis of coronary artery bypass graft(s), unspecified, with other forms of angina pectoris: Secondary | ICD-10-CM

## 2024-01-11 DIAGNOSIS — E7800 Pure hypercholesterolemia, unspecified: Secondary | ICD-10-CM

## 2024-01-11 DIAGNOSIS — R0989 Other specified symptoms and signs involving the circulatory and respiratory systems: Secondary | ICD-10-CM

## 2024-01-11 NOTE — Patient Instructions (Signed)
 Medication Instructions:  Continue same medications *If you need a refill on your cardiac medications before your next appointment, please call your pharmacy*   Lab Work: None ordered   Testing/Procedures: Carotid dopplers  first available   Follow-Up: At [facility], you and your health needs are our priority.  As part of our continuing mission to provide you with exceptional heart care, we have created designated Provider Care Teams.  These Care Teams include your primary Cardiologist (physician) and Advanced Practice Providers (APPs -  Physician Assistants and Nurse Practitioners) who all work together to provide you with the care you need, when you need it.  We recommend signing up for the patient portal called "MyChart".  Sign up information is provided on this After Visit Summary.  MyChart is used to connect with patients for Virtual Visits (Telemedicine).  Patients are able to view lab/test results, encounter notes, upcoming appointments, etc.  Non-urgent messages can be sent to your provider as well.   To learn more about what you can do with MyChart, go to [email or web address].    Your next appointment:  6 months   Call in April to schedule August appointment     Provider:  Dr.Jordan

## 2024-01-31 ENCOUNTER — Ambulatory Visit (HOSPITAL_COMMUNITY)
Admission: RE | Admit: 2024-01-31 | Discharge: 2024-01-31 | Disposition: A | Discharge status: Home / Self Care | Payer: Medicare HMO | Source: Ambulatory Visit | Attending: Cardiovascular Disease | Admitting: Cardiovascular Disease

## 2024-01-31 DIAGNOSIS — R0989 Other specified symptoms and signs involving the circulatory and respiratory systems: Secondary | ICD-10-CM | POA: Insufficient documentation

## 2024-01-31 DIAGNOSIS — I25708 Atherosclerosis of coronary artery bypass graft(s), unspecified, with other forms of angina pectoris: Secondary | ICD-10-CM | POA: Insufficient documentation

## 2024-01-31 DIAGNOSIS — E7800 Pure hypercholesterolemia, unspecified: Secondary | ICD-10-CM | POA: Diagnosis not present

## 2024-01-31 DIAGNOSIS — I4892 Unspecified atrial flutter: Secondary | ICD-10-CM | POA: Insufficient documentation

## 2024-02-12 ENCOUNTER — Ambulatory Visit: Payer: Medicare HMO | Attending: Cardiology | Admitting: *Deleted

## 2024-02-12 DIAGNOSIS — I4891 Unspecified atrial fibrillation: Secondary | ICD-10-CM | POA: Diagnosis not present

## 2024-02-12 DIAGNOSIS — Z7901 Long term (current) use of anticoagulants: Secondary | ICD-10-CM

## 2024-02-12 DIAGNOSIS — I4892 Unspecified atrial flutter: Secondary | ICD-10-CM

## 2024-02-12 LAB — POCT INR: INR: 2.7 (ref 2.0–3.0)

## 2024-02-12 NOTE — Patient Instructions (Signed)
 Description   Continue taking warfarin 1 tablet daily except 1.5 tablets each Sunday and Fridays. Repeat INR in 6 weeks.  [facility]in Clinic [phone]     New Address: [address] [place] [address]

## 2024-03-25 ENCOUNTER — Ambulatory Visit: Attending: Cardiology | Admitting: *Deleted

## 2024-03-25 DIAGNOSIS — Z7901 Long term (current) use of anticoagulants: Secondary | ICD-10-CM | POA: Diagnosis not present

## 2024-03-25 DIAGNOSIS — I4891 Unspecified atrial fibrillation: Secondary | ICD-10-CM

## 2024-03-25 DIAGNOSIS — I4892 Unspecified atrial flutter: Secondary | ICD-10-CM | POA: Diagnosis not present

## 2024-03-25 LAB — POCT INR: INR: 1.8 — AB (ref 2.0–3.0)

## 2024-03-25 NOTE — Patient Instructions (Signed)
 Description   Today take 1.5 tablets of warfarin then continue taking warfarin 1 tablet daily except 1.5 tablets each Sunday and Fridays. Repeat INR in 5 weeks.  [facility]in Clinic [phone]

## 2024-04-29 ENCOUNTER — Ambulatory Visit: Attending: Cardiology

## 2024-04-29 DIAGNOSIS — I4891 Unspecified atrial fibrillation: Secondary | ICD-10-CM

## 2024-04-29 DIAGNOSIS — Z7901 Long term (current) use of anticoagulants: Secondary | ICD-10-CM

## 2024-04-29 DIAGNOSIS — I4892 Unspecified atrial flutter: Secondary | ICD-10-CM

## 2024-04-29 LAB — POCT INR: INR: 2.6 (ref 2.0–3.0)

## 2024-04-29 NOTE — Patient Instructions (Signed)
 continue taking warfarin 1 tablet daily except 1.5 tablets each Sunday and Fridays. Repeat INR in 6 weeks.  Coumadin  Clinic [phone]

## 2024-06-10 ENCOUNTER — Ambulatory Visit: Attending: Cardiology

## 2024-06-10 DIAGNOSIS — I4891 Unspecified atrial fibrillation: Secondary | ICD-10-CM | POA: Diagnosis not present

## 2024-06-10 DIAGNOSIS — Z7901 Long term (current) use of anticoagulants: Secondary | ICD-10-CM | POA: Diagnosis not present

## 2024-06-10 DIAGNOSIS — I4892 Unspecified atrial flutter: Secondary | ICD-10-CM | POA: Diagnosis not present

## 2024-06-10 LAB — POCT INR: INR: 2.3 (ref 2.0–3.0)

## 2024-06-10 NOTE — Patient Instructions (Signed)
 continue taking warfarin 1 tablet daily except 1.5 tablets each Sunday and Fridays. Repeat INR in 6 weeks.  Coumadin  Clinic [phone]

## 2024-06-10 NOTE — Progress Notes (Signed)
 INR 2.3. Please see anticoagulation encounter

## 2024-06-27 NOTE — Progress Notes (Signed)
 [name] Date of Birth: 1946/08/01   History of Present Illness: [name] is seen for  followup CAD. He has a history of coronary disease and is status post CABG with a Maze procedure in 2007 by Dr. Fleeta Ochoa. Prior to CABG EF was 35-40%. CABG included an LIMA graft to LAD, saphenous vein graft to the obtuse marginal vessel, and saphenous vein graft sequentially to the PDA and posterior lateral branches of the right coronary. He quit smoking in 2007. He has permanent Afib on anticoagulation on Coumadin  due to cost.  He was seen in the ED on 12/22/16 for symptoms of dyspnea on exertion, dry cough, chest pain and mid scapular pain. Ecg showed NSR with PVCs, prolonged QT. No acute ischemic changes. He had some edema. BNP elevated at 489. CXR showed mild vascular congestion and effusions. CTA showed no pulmonary embolus. There was enlargement of the thoracic aorta to 4 cm. He was sent home on lasix  and potassium. Follow up Echo showed EF 35-40%. Myoview  showed evidence of old inferior infarct. No ischemia. EF 45%. He was started on losartan .  On follow up today he reports he is doing well.  He denies any chest pain, dyspnea, edema, palpitations, or claudication. Generally not very active but has been cleaning out his parents home in order to sell. States he has started back bowling.   Current Outpatient Medications on File Prior to Visit  Medication Sig Dispense Refill   ADVAIR HFA 115-21 MCG/ACT inhaler SMARTSIG:2 Puff(s) By Mouth Twice Daily     albuterol  (VENTOLIN  HFA) 108 (90 Base) MCG/ACT inhaler SMARTSIG:1-2 Puff(s) By Mouth Every 4 Hours PRN     No current facility-administered medications on file prior to visit.    No Known Allergies  Past Medical History:  Diagnosis Date   AAA (abdominal aortic aneurysm) (HCC)    Arthritis    CAD (coronary artery disease) of artery bypass graft    Colon polyp 2015   Diarrhea    Diverticulosis    Dyslipidemia    Hemorrhoids    HTN  (hypertension)    Hyperlipidemia    Hypertension    Low back pain    PAC (premature atrial contraction)    Paroxysmal atrial flutter (HCC)    Problems with hearing     Past Surgical History:  Procedure Laterality Date   CORONARY ARTERY BYPASS GRAFT  2007   CORONARY ARTERY BYPASS GRAFT     LUMBAR DISC ARTHROPLASTY     MAZE  2007   MAZE PROCEDURE   TONSILLECTOMY      Social History   Tobacco Use  Smoking Status Former   Current packs/day: 0.00   Average packs/day: 1 pack/day for 30.0 years (30.0 ttl pk-yrs)   Types: Cigarettes   Start date: 11/14/1975   Quit date: 11/13/2005   Years since quitting: 18.6  Smokeless Tobacco Never    Social History   Substance and Sexual Activity  Alcohol Use No    Family History  Problem Relation Age of Onset   Diabetes Mother    Heart disease Father     Review of Systems: As noted in history of present illness. All other systems are reviewed and are negative.   Physical Exam: BP 128/68 (BP Location: Left Arm, Patient Position: Sitting)   Pulse 62   Resp 16   Ht 5' 6 (1.676 m)   Wt 202 lb 9.6 oz (91.9 kg)   SpO2 92%   BMI 32.70 kg/m  GENERAL:  Well appearing WM in NAD HEENT:  PERRL, EOMI, sclera are clear. Oropharynx is clear. NECK:  right carotid bruit LUNGS:  Clear to auscultation bilaterally CHEST:  Unremarkable HEART:  IRRR,  PMI not displaced or sustained,S1 and S2 within normal limits, no S3, no S4: no clicks, no rubs, no murmurs ABD:  Soft, nontender. BS +, no masses or bruits. No hepatomegaly, no splenomegaly EXT:  2 + pulses throughout, no edema, no cyanosis no clubbing SKIN:  Warm and dry.  No rashes NEURO:  Alert and oriented x 3. Cranial nerves II through XII intact. PSYCH:  Cognitively intact     LABORATORY DATA: Lab Results  Component Value Date   WBC 7.1 12/22/2016   HGB 13.1 12/22/2016   HCT 39.4 12/22/2016   PLT 204 12/22/2016   GLUCOSE 98 05/02/2022   CHOL 138 01/08/2017   TRIG 88 01/08/2017    HDL 46 01/08/2017   LDLDIRECT 124.1 02/07/2012   LDLCALC 74 01/08/2017   ALT 25 01/08/2017   AST 21 01/08/2017   NA 143 05/02/2022   K 4.5 05/02/2022   CL 101 05/02/2022   CREATININE 0.74 (L) 05/02/2022   BUN 13 05/02/2022   CO2 28 05/02/2022   INR 2.3 06/10/2024   Labs dated 05/24/16: cholesterol 155, triglycerides 124, HDL 48, LDL 82.  Dated 05/30/17: cholesterol 160, triglycerides 144, HDL 48, LDL 84, Hgb 14.5. Creatinine 0.8, ALt normal Dated 06/25/18: cholesterol 141, triglycerides 92, HDL 52, LDL 70. Normal chemistry and CBC.  Dated 07/15/19: A1c 5.9%. cholesterol 137, triglycerides 109, HDL 50, LDL 65. CMET and CBC normal.  Dated 07/26/20: cholesterol 136, triglycerides 80, HDL 55, LDL 74. A1c 6.1%. CBC and CMET normal. Dated 08/08/21: cholesterol 127, triglycerides 113, HDL 47, LDL 59. A1c 6%. CBC and CMET normal Dated 09/18/22: A1c 5.9%. cholesterol 155, triglycerides 67, HDL 63, LDL 79, CMET and CBC normal.  Dated 10/15/23: A1c 6%, cholesterol 151, triglycerides 113, HDL 59, LDL 72. CMET and TSH normal EKG Interpretation Date/Time:  Wednesday July 09 2024 10:00:45 EDT Ventricular Rate:  73 PR Interval:    QRS Duration:  140 QT Interval:  466 QTC Calculation: 513 R Axis:   80  Text Interpretation: Atrial fibrillation Right bundle branch block When compared with ECG of 11-Jul-2023 08:04, Right bundle branch block is now Present Confirmed by [place], Chyann Ambrocio [phone]) on 07/09/2024 10:12:20 AM   EKG Interpretation Date/Time:  Wednesday July 09 2024 10:00:45 EDT Ventricular Rate:  73 PR Interval:    QRS Duration:  140 QT Interval:  466 QTC Calculation: 513 R Axis:   80  Text Interpretation: Atrial fibrillation Right bundle branch block When compared with ECG of 11-Jul-2023 08:04, Right bundle branch block is now Present Confirmed by [place], Orland Visconti [phone]) on 07/09/2024 10:12:20 AM   Study Result   Echo 01/29/17: Study Conclusions   - Left ventricle: The cavity size was  normal. Wall thickness was   normal. Systolic function was moderately reduced. The estimated   ejection fraction was in the range of 35% to 40%. Moderate   diffuse hypokinesis with distinct regional wall motion   abnormalities. - Mitral valve: Calcified annulus. - Left atrium: The atrium was severely dilated. - Right atrium: The atrium was mildly dilated.  Myoview  01/17/17: Study Highlights     Nuclear stress EF: 45%. Blood pressure demonstrated a hypertensive response to exercise. There was no ST segment deviation noted during stress. Findings consistent with prior myocardial infarction. This is an intermediate risk study. The left  ventricular ejection fraction is mildly decreased (45-54%).   Small area of inferior wall infarct at mid and basal level no ischemia EF 45% apical hypokinesis Frequent ventricular ectopy during stress and recovery  HTN response to exercise    CT ANGIOGRAPHY CHEST WITH CONTRAST   TECHNIQUE: Multidetector CT imaging of the chest was performed using the standard protocol during bolus administration of intravenous contrast. Multiplanar CT image reconstructions and MIPs were obtained to evaluate the vascular anatomy.   CONTRAST:  75mL ISOVUE -370 IOPAMIDOL  (ISOVUE -370) INJECTION 76%   COMPARISON:  12/31/2017   FINDINGS: Cardiovascular: Maximal diameter of the ascending aorta at the sinus of Valsalva, sino-tubular junction, and ascending aorta are 4.0 cm, 3.0 cm, and 3.7 cm. This compares with a maximal diameter of the ascending aorta of 4.0 cm on the prior study. There is no evidence of aortic dissection. There is no obvious acute intramural hematoma. Atherosclerotic calcifications of the aortic arch and great vessels are noted. Visualized great vessels are patent. Postoperative changes from CABG are noted.   Mediastinum/Nodes: No abnormal mediastinal adenopathy or pericardial effusion. The thyroid is heterogeneous without discrete focal nodule.  Esophagus is within normal limits.   Lungs/Pleura: Apical blebs are noted. Subpleural nodules in the right hemithorax are stable supporting benign etiology. Scarring versus atelectasis at the medial right lung base. No pneumothorax or pleural effusion.   Upper Abdomen: No acute abnormality.   Musculoskeletal: No vertebral compression deformity. No acute rib fracture. Sternotomy has healed with sternal wires in place.   Review of the MIP images confirms the above findings.   IMPRESSION: Maximal diameter of the ascending aorta is 3.7 cm. This is nonaneurysmal. Postoperative and atherosclerotic changes are noted.   Aortic Atherosclerosis (ICD10-I70.0).     Electronically Signed   By: Rome Hall M.D.   On: 01/01/2019 09:48   Abdominal US  12/30/18: Summary: Abdominal Aorta: The largest aortic measurement is 2.8 cm. The largest aortic diameter remains essentially unchanged compared to prior exam. Previous diameter measurement was 2.9 cm obtained on 05/2015.   CT ANGIOGRAPHY CHEST WITH CONTRAST   TECHNIQUE: Multidetector CT imaging of the chest was performed using the standard protocol during bolus administration of intravenous contrast. Multiplanar CT image reconstructions and MIPs were obtained to evaluate the vascular anatomy.   CONTRAST:  OMNIPAQUE  IOHEXOL  350 MG/ML SOLN   COMPARISON:  CT 8 01/01/2019   FINDINGS: Cardiovascular: The aortic root at the sinuses of Valsalva measures 4.0 cm and stable. Sinotubular junction measures 2.8 cm and stable. Mid ascending thoracic aorta measures 3.7 cm and stable. Again noted are coronary bypass grafts coming off the ascending thoracic aorta. Typical three-vessel arch anatomy. Great vessels are patent. Atherosclerotic calcifications at the aortic arch and near the origin of the great vessels without significant stenosis. Proximal descending thoracic aorta measures 2.6 cm and stable. Mild atherosclerotic disease involving the  descending thoracic aorta without aneurysm or dissection. Distal descending thoracic aorta measures 2.3 cm and stable. Again noted are calcifications at the origin of the celiac trunk but the stenosis is less than 50%. Small amount of plaque at the origin of the SMA without significant stenosis. Normal caliber of the proximal abdominal aorta. Main and central pulmonary arteries are patent without large filling defects.   Mediastinum/Nodes: Low-density nodule involving the left thyroid gland that measures roughly 2.1 cm. No mediastinal or hilar lymphadenopathy.   Lungs/Pleura: Centrilobular and paraseptal emphysema. Trachea and mainstem bronchi are patent. Stable pleural thickening and scarring along the medial right lower lobe.  Minimal scarring in the superior segment of the right lower lobe is stable. Stable tiny pleural-based nodules in the right lung. There is a small broad-based pleural nodule in the right upper lobe on sequence 5 image 34 that measures roughly 5 mm and stable. Several tiny pleural-based nodules in left chest are stable. No significant airspace disease or consolidation in the lungs. No large pleural effusions.   Upper Abdomen: No acute abnormality in the upper abdomen.   Musculoskeletal: Median sternotomy wires. Bridging osteophytes in the thoracic spine.   Review of the MIP images confirms the above findings.   IMPRESSION: 1. Thoracic aorta is stable in size. Aortic root measures up to 4.0 cm and the ascending thoracic aorta measures 3.7 cm. Negative for an aortic dissection. 2. No acute chest abnormalities. Stable areas of scarring and stable small pleural-based nodules. 3. Aortic Atherosclerosis (ICD10-I70.0) and Emphysema (ICD10-J43.9). 4. Left thyroid nodule measuring greater than 2 cm. Recommend thyroid ultrasound (ref: J Am Coll Radiol. 2015 Feb;12(2): 143-50).     Electronically Signed   By: Juliene Balder M.D.   On: 02/11/2020 17:31   Echo  05/10/22: IMPRESSIONS     1. Left ventricular ejection fraction, by estimation, is 40 to 45%. The  left ventricle has mildly decreased function. The left ventricle  demonstrates regional wall motion abnormalities (see scoring  diagram/findings for description). There is moderate  left ventricular hypertrophy of the inferior segment. Left ventricular  diastolic parameters are indeterminate. There is severe hypokinesis of the  left ventricular, entire inferolateral wall. There is severe hypokinesis  of the left ventricular, basal-mid  inferior wall.   2. Right ventricular systolic function was not well visualized. The right  ventricular size is mildly enlarged. There is mildly elevated pulmonary  artery systolic pressure. The estimated right ventricular systolic  pressure is 36.4 mmHg.   3. Left atrial size was mildly dilated.   4. The mitral valve is normal in structure. Mild mitral valve  regurgitation. No evidence of mitral stenosis.   5. The aortic valve is normal in structure. Aortic valve regurgitation is  trivial. No aortic stenosis is present.   6. Aortic dilatation noted. There is borderline dilatation of the aortic  root, measuring 37 mm. There is borderline dilatation of the aortic root,  measuring 37 mm.   7. The inferior vena cava is normal in size with greater than 50%  respiratory variability, suggesting right atrial pressure of 3 mmHg.   Assessment / Plan: 1. Chronic combined systolic and diastolic CHF. EF 35-40% by Echo 2018. 45% by Myoview  at the same time. More recent Echo with EF 40-45%.  - intolerant of Coreg  due to bradycardia. - continue lasix  and losartan .  - encourage increased aerobic activity and weight loss.  - currently class 1 symptoms -  If CHF symptoms were to progress would add aldactone +/- SGLT 2 inhibitor   2. CAD s/p CABG 2007. On ASA, statin. Myoview  in March 2018 showed inferior infarct without ischemia. - no significant angina  3. Atrial  fibrillation. Persistent/permanent.  HR well controlled on no meds. Asymptomatic.  [place] Vasc score of 4. On Coumadin   3. Hyperlipidemia on statin. Last LDL 72. Needs to focus more on weight loss and dietary modification  4. HTN controlled.  5. RBBB  Follow up in 6 months.

## 2024-07-09 ENCOUNTER — Encounter: Payer: Self-pay | Admitting: Cardiology

## 2024-07-09 ENCOUNTER — Ambulatory Visit: Attending: Cardiology | Admitting: Cardiology

## 2024-07-09 VITALS — BP 128/68 | HR 62 | Resp 16 | Ht 66.0 in | Wt 202.6 lb

## 2024-07-09 DIAGNOSIS — I4891 Unspecified atrial fibrillation: Secondary | ICD-10-CM

## 2024-07-09 MED ORDER — POTASSIUM CHLORIDE CRYS ER 20 MEQ PO TBCR: 20.0000 meq | EXTENDED_RELEASE_TABLET | Freq: Every day | ORAL | 3 refills | Status: AC

## 2024-07-09 MED ORDER — LOSARTAN POTASSIUM 50 MG PO TABS: 50.0000 mg | ORAL_TABLET | Freq: Every day | ORAL | 3 refills | Status: AC

## 2024-07-09 MED ORDER — WARFARIN SODIUM 5 MG PO TABS: ORAL_TABLET | ORAL | 3 refills | Status: DC

## 2024-07-09 MED ORDER — ATORVASTATIN CALCIUM 40 MG PO TABS: 40.0000 mg | ORAL_TABLET | Freq: Every day | ORAL | 3 refills | Status: AC

## 2024-07-09 MED ORDER — FUROSEMIDE 20 MG PO TABS: 20.0000 mg | ORAL_TABLET | Freq: Every day | ORAL | 3 refills | Status: AC

## 2024-07-09 NOTE — Patient Instructions (Signed)
   Follow-Up: At [facility], you and your health needs are our priority.  As part of our continuing mission to provide you with exceptional heart care, our providers are all part of one team.  This team includes your primary Cardiologist (physician) and Advanced Practice Providers or APPs (Physician Assistants and Nurse Practitioners) who all work together to provide you with the care you need, when you need it.  Your next appointment:   6 month(s)  Provider:   Peter [place], MD

## 2024-07-20 ENCOUNTER — Other Ambulatory Visit: Payer: Self-pay | Admitting: Cardiology

## 2024-07-22 ENCOUNTER — Ambulatory Visit: Attending: Cardiology | Admitting: Pharmacist

## 2024-07-22 DIAGNOSIS — I4892 Unspecified atrial flutter: Secondary | ICD-10-CM | POA: Diagnosis not present

## 2024-07-22 DIAGNOSIS — I4891 Unspecified atrial fibrillation: Secondary | ICD-10-CM

## 2024-07-22 DIAGNOSIS — Z7901 Long term (current) use of anticoagulants: Secondary | ICD-10-CM

## 2024-07-22 LAB — POCT INR: INR: 1.9 — AB (ref 2.0–3.0)

## 2024-07-22 NOTE — Telephone Encounter (Signed)
 Warfarin 5mg  refill sent 07/21/24.

## 2024-07-22 NOTE — Progress Notes (Signed)
 INR 1.9; Please see anticoagulation encounter   Description   Take 2 tablets today and then continue taking warfarin 1 tablet daily except 1.5 tablets each Sunday and Fridays. Repeat INR in 5 weeks.  Coumadin  Clinic [phone]

## 2024-07-22 NOTE — Patient Instructions (Signed)
 Description   Take 2 tablets today and then continue taking warfarin 1 tablet daily except 1.5 tablets each Sunday and Fridays. Repeat INR in 5 weeks.  Coumadin  Clinic [phone]

## 2024-08-25 ENCOUNTER — Ambulatory Visit: Attending: Cardiology | Admitting: *Deleted

## 2024-08-25 DIAGNOSIS — Z7901 Long term (current) use of anticoagulants: Secondary | ICD-10-CM | POA: Diagnosis not present

## 2024-08-25 DIAGNOSIS — I4891 Unspecified atrial fibrillation: Secondary | ICD-10-CM | POA: Diagnosis not present

## 2024-08-25 DIAGNOSIS — I4892 Unspecified atrial flutter: Secondary | ICD-10-CM | POA: Diagnosis not present

## 2024-08-25 LAB — POCT INR: INR: 2.3 (ref 2.0–3.0)

## 2024-08-25 NOTE — Patient Instructions (Signed)
 Description   INR-2.3; Continue taking warfarin 1 tablet daily except 1.5 tablets each Sunday and Fridays. Repeat INR in 6 weeks.  Coumadin  Clinic [phone]

## 2024-08-25 NOTE — Progress Notes (Signed)
 Description   INR-2.3; Continue taking warfarin 1 tablet daily except 1.5 tablets each Sunday and Fridays. Repeat INR in 6 weeks.  Coumadin  Clinic [phone]

## 2024-10-06 ENCOUNTER — Ambulatory Visit: Attending: Cardiology

## 2024-10-06 DIAGNOSIS — I4892 Unspecified atrial flutter: Secondary | ICD-10-CM

## 2024-10-06 DIAGNOSIS — Z7901 Long term (current) use of anticoagulants: Secondary | ICD-10-CM

## 2024-10-06 DIAGNOSIS — I4891 Unspecified atrial fibrillation: Secondary | ICD-10-CM | POA: Diagnosis not present

## 2024-10-06 LAB — POCT INR: INR: 1.7 — AB (ref 2.0–3.0)

## 2024-10-06 NOTE — Patient Instructions (Signed)
 Description   INR-1.7; Take 1.5 tablets today, then resume same dosage of warfarin 1 tablet daily except 1.5 tablets each Sunday and Fridays. Repeat INR in 5 weeks.  Coumadin  Clinic [phone]

## 2024-10-06 NOTE — Progress Notes (Signed)
 Description   INR-1.7; Take 1.5 tablets today, then resume same dosage of warfarin 1 tablet daily except 1.5 tablets each Sunday and Fridays. Repeat INR in 5 weeks.  Coumadin  Clinic [phone]

## 2024-11-10 ENCOUNTER — Ambulatory Visit

## 2024-11-19 ENCOUNTER — Ambulatory Visit: Admitting: Pharmacist

## 2024-11-19 DIAGNOSIS — I4892 Unspecified atrial flutter: Secondary | ICD-10-CM

## 2024-11-19 DIAGNOSIS — I4891 Unspecified atrial fibrillation: Secondary | ICD-10-CM | POA: Diagnosis not present

## 2024-11-19 DIAGNOSIS — Z7901 Long term (current) use of anticoagulants: Secondary | ICD-10-CM

## 2024-11-19 LAB — POCT INR: INR: 1.8 — AB (ref 2.0–3.0)

## 2024-11-19 NOTE — Progress Notes (Signed)
 Description   INR-1.8; Take 1.5 tablets today, and then increase to 1.5 tablets Sundays, Wednesdays, and Fridays. 1 tablet the rest of the week  Coumadin  Clinic [phone]

## 2024-11-19 NOTE — Patient Instructions (Signed)
 Description   INR-1.8; Take 1.5 tablets today, and then increase to 1.5 tablets Sundays, Wednesdays, and Fridays. 1 tablet the rest of the week  Coumadin  Clinic [phone]

## 2024-12-09 ENCOUNTER — Ambulatory Visit
Admission: RE | Admit: 2024-12-09 | Discharge: 2024-12-09 | Disposition: A | Discharge status: Home / Self Care | Source: Ambulatory Visit | Attending: Emergency Medicine | Admitting: Emergency Medicine

## 2024-12-09 VITALS — BP 143/86 | HR 92 | Temp 98.4°F | Resp 17

## 2024-12-09 DIAGNOSIS — M25562 Pain in left knee: Secondary | ICD-10-CM

## 2024-12-09 NOTE — ED Triage Notes (Signed)
 Pt c/o left knee pain and swelling on medial knee since last Thursday. States he went boweling Wednesday and Thursday and pain began Thursday evening.

## 2024-12-09 NOTE — ED Provider Notes (Signed)
 HPI  SUBJECTIVE:  [name] is a 79 y.o. male who presents with left medial knee pain/swelling starting 6 days ago after bowling for multiple days in a row.  He states the pain is getting worse.  He describes the pain as dull that becomes sharp with sudden movement.  He denies trauma or twisting his knee.  No erythema, increased temperatures, fevers, distal numbness or tingling.  He states that he heard a pop twice when he put torque on his knee, but has had no further episodes.  It has not given way.  He has been doing ice, elevation and a over-the-counter knee sleeve with some improvement in his symptoms.  Symptoms are worse with sudden movement.  He has a past medical history of aortic abdominal aneurysm, coronary artery disease, status post CABG, hypertension, hyperlipidemia, paroxysmal atrial flutter status post ablation, CHF, on warfarin.  Last INR done on 11/19/2024 1.8.  Past Medical History:  Diagnosis Date   AAA (abdominal aortic aneurysm)    Arthritis    CAD (coronary artery disease) of artery bypass graft    Colon polyp 2015   Diarrhea    Diverticulosis    Dyslipidemia    Hemorrhoids    HTN (hypertension)    Hyperlipidemia    Hypertension    Low back pain    PAC (premature atrial contraction)    Paroxysmal atrial flutter (HCC)    Problems with hearing     Past Surgical History:  Procedure Laterality Date   CORONARY ARTERY BYPASS GRAFT  2007   CORONARY ARTERY BYPASS GRAFT     LUMBAR DISC ARTHROPLASTY     MAZE  2007   MAZE PROCEDURE   TONSILLECTOMY      Family History  Problem Relation Age of Onset   Diabetes Mother    Heart disease Father     Social History[1]  Current Medications[2]  Allergies[3]   ROS  As noted in HPI.   Physical Exam  BP (!) 143/86 (BP Location: Right Arm)   Pulse 92   Temp 98.4 F (36.9 C) (Oral)   Resp 17   SpO2 94%   Constitutional: Well developed, well nourished, no acute distress Eyes:  EOMI, conjunctiva  normal bilaterally HENT: Normocephalic, atraumatic,mucus membranes moist Respiratory: Normal inspiratory effort Cardiovascular: Normal rate GI: nondistended skin: No rash, skin intact Musculoskeletal: L Knee ROM baseline for Pt , Flexion  intact, Patella NT, Patellar tendon NT, Medial joint tender with swelling, Lateral joint NT, Popliteal region NT, Varus MCL stress testing stable, Valgus LCL stress testing stable, ACL/PCL stable, McMurray positive.  Distal NVI with intact baseline sensation / motor / pulse distal to knee.  No effusion. No erythema. No increased temperature. No crepitus.   Neurologic: Alert & oriented x 3, no focal neuro deficits Psychiatric: Speech and behavior appropriate   ED Course   Medications - No data to display  No orders of the defined types were placed in this encounter.   No results found for this or any previous visit (from the past 24 hours). No results found.  ED Clinical Impression  1. Medial knee pain, left   2. Acute pain of left knee      ED Assessment/Plan     I suspect patient has a meniscal injury/tear.  No evidence of infection, gout, seriously doubt fracture, so deferring x-ray.  Patient is amenable to this.  We do not have x-ray available at this time anyway.  Will have him continue ice, elevation, rest,  advised a neoprene knee sleeve.  Will have him follow-up with any one of the orthopedic urgent cares here in [facility] for further management and evaluation.  Discussed MDM, treatment plan, and plan for follow-up with patient. patient agrees with plan.   No orders of the defined types were placed in this encounter.     *This clinic note was created using Dragon dictation software. Therefore, there may be occasional mistakes despite careful proofreading.  ?     [1]  Social History Tobacco Use   Smoking status: Former    Current packs/day: 0.00    Average packs/day: 1 pack/day for 30.0 years (30.0 ttl pk-yrs)    Types:  Cigarettes    Start date: 11/14/1975    Quit date: 11/13/2005    Years since quitting: 19.0   Smokeless tobacco: Never  Vaping Use   Vaping status: Never Used  Substance Use Topics   Alcohol use: No   Drug use: No  [2] No current facility-administered medications for this encounter.  Current Outpatient Medications:    ADVAIR HFA 115-21 MCG/ACT inhaler, SMARTSIG:2 Puff(s) By Mouth Twice Daily, Disp: , Rfl:    albuterol  (VENTOLIN  HFA) 108 (90 Base) MCG/ACT inhaler, SMARTSIG:1-2 Puff(s) By Mouth Every 4 Hours PRN, Disp: , Rfl:    atorvastatin  (LIPITOR) 40 MG tablet, Take 1 tablet (40 mg total) by mouth daily., Disp: 90 tablet, Rfl: 3   furosemide  (LASIX ) 20 MG tablet, Take 1 tablet (20 mg total) by mouth daily., Disp: 90 tablet, Rfl: 3   losartan  (COZAAR ) 50 MG tablet, Take 1 tablet (50 mg total) by mouth daily., Disp: 90 tablet, Rfl: 3   potassium chloride  SA (KLOR-CON  M) 20 MEQ tablet, Take 1 tablet (20 mEq total) by mouth daily., Disp: 90 tablet, Rfl: 3   warfarin (COUMADIN ) 5 MG tablet, TAKE 1 TO 1 & 1/2 (ONE & ONE-HALF) TABLETS BY MOUTH ONCE DAILY ** 90 DAY SUPPLY**, Disp: 120 tablet, Rfl: 0 [3] No Known Allergies    Van Knee, MD 12/09/24 [phone]

## 2024-12-09 NOTE — Discharge Instructions (Signed)
 I suspect that you have injured the meniscus of your knee.  Follow-up with anyone of the orthopedic urgent cares ASAP for further management.  In the meantime, continue ice, elevation, 1000 mg of Tylenol  3 or 4 times a day as needed for pain.  I would suggest a neoprene knee sleeve to give you additional support.  You can get them off [employer].  The place that I was telling you about that is located here in [place] is [employer]  https://www.[employer].com/collections/knee-compression?[email or web address]  They make high-quality knee sleeves.

## 2024-12-17 ENCOUNTER — Ambulatory Visit: Admitting: Pharmacist

## 2024-12-17 DIAGNOSIS — I4891 Unspecified atrial fibrillation: Secondary | ICD-10-CM

## 2024-12-17 DIAGNOSIS — I4892 Unspecified atrial flutter: Secondary | ICD-10-CM

## 2024-12-17 DIAGNOSIS — Z7901 Long term (current) use of anticoagulants: Secondary | ICD-10-CM

## 2024-12-17 LAB — POCT INR: INR: 1.9 — AB (ref 2.0–3.0)

## 2024-12-17 NOTE — Progress Notes (Signed)
 Description   INR-1.9; Take 2 tablets today, and then continue 1.5 tablets Sundays, Wednesdays, and Fridays. 1 tablet the rest of the week  Coumadin  Clinic [phone]

## 2024-12-17 NOTE — Patient Instructions (Signed)
 Description   INR-1.9; Take 2 tablets today, and then continue 1.5 tablets Sundays, Wednesdays, and Fridays. 1 tablet the rest of the week  Coumadin  Clinic [phone]

## 2025-01-09 ENCOUNTER — Ambulatory Visit: Admitting: Family Medicine

## 2025-01-14 ENCOUNTER — Ambulatory Visit
